# Patient Record
Sex: Female | Born: 1950 | Race: White | Hispanic: No | Smoking: Former smoker
Health system: Southern US, Community
[De-identification: ages and names within clinical notes are randomized; demographics above are authoritative.]

## PROBLEM LIST (undated history)

## (undated) DIAGNOSIS — T7840XA Allergy, unspecified, initial encounter: Secondary | ICD-10-CM

## (undated) DIAGNOSIS — E785 Hyperlipidemia, unspecified: Secondary | ICD-10-CM

## (undated) DIAGNOSIS — F32A Depression, unspecified: Secondary | ICD-10-CM

## (undated) DIAGNOSIS — F329 Major depressive disorder, single episode, unspecified: Secondary | ICD-10-CM

## (undated) DIAGNOSIS — K3184 Gastroparesis: Secondary | ICD-10-CM

## (undated) DIAGNOSIS — J449 Chronic obstructive pulmonary disease, unspecified: Secondary | ICD-10-CM

## (undated) DIAGNOSIS — Z5189 Encounter for other specified aftercare: Secondary | ICD-10-CM

## (undated) DIAGNOSIS — J439 Emphysema, unspecified: Secondary | ICD-10-CM

## (undated) DIAGNOSIS — F419 Anxiety disorder, unspecified: Secondary | ICD-10-CM

## (undated) HISTORY — DX: Chronic obstructive pulmonary disease, unspecified: J44.9

## (undated) HISTORY — DX: Hyperlipidemia, unspecified: E78.5

## (undated) HISTORY — DX: Gastroparesis: K31.84

## (undated) HISTORY — DX: Encounter for other specified aftercare: Z51.89

## (undated) HISTORY — PX: TONSILLECTOMY: SUR1361

## (undated) HISTORY — PX: ABDOMINAL HYSTERECTOMY: SHX81

## (undated) HISTORY — DX: Allergy, unspecified, initial encounter: T78.40XA

## (undated) HISTORY — DX: Anxiety disorder, unspecified: F41.9

## (undated) HISTORY — DX: Emphysema, unspecified: J43.9

## (undated) HISTORY — DX: Major depressive disorder, single episode, unspecified: F32.9

## (undated) HISTORY — DX: Depression, unspecified: F32.A

## (undated) HISTORY — PX: SPINE SURGERY: SHX786

## (undated) HISTORY — PX: OTHER SURGICAL HISTORY: SHX169

## (undated) HISTORY — PX: TUBAL LIGATION: SHX77

## (undated) HISTORY — PX: BACK SURGERY: SHX140

---

## 2005-05-28 ENCOUNTER — Emergency Department (HOSPITAL_COMMUNITY): Admission: EM | Admit: 2005-05-28 | Discharge: 2005-05-29 | Payer: Self-pay | Admitting: Emergency Medicine

## 2005-08-20 ENCOUNTER — Emergency Department (HOSPITAL_COMMUNITY): Admission: EM | Admit: 2005-08-20 | Discharge: 2005-08-21 | Payer: Self-pay | Admitting: Emergency Medicine

## 2005-09-12 ENCOUNTER — Ambulatory Visit (HOSPITAL_COMMUNITY): Admission: RE | Admit: 2005-09-12 | Discharge: 2005-09-12 | Payer: Self-pay | Admitting: Family Medicine

## 2005-10-12 ENCOUNTER — Ambulatory Visit (HOSPITAL_COMMUNITY): Admission: RE | Admit: 2005-10-12 | Discharge: 2005-10-12 | Payer: Self-pay | Admitting: Family Medicine

## 2005-10-13 ENCOUNTER — Ambulatory Visit: Payer: Self-pay | Admitting: Gastroenterology

## 2005-10-20 ENCOUNTER — Ambulatory Visit (HOSPITAL_COMMUNITY): Admission: RE | Admit: 2005-10-20 | Discharge: 2005-10-20 | Payer: Self-pay | Admitting: Gastroenterology

## 2005-11-30 ENCOUNTER — Ambulatory Visit: Payer: Self-pay | Admitting: Gastroenterology

## 2005-12-03 ENCOUNTER — Emergency Department (HOSPITAL_COMMUNITY): Admission: EM | Admit: 2005-12-03 | Discharge: 2005-12-03 | Payer: Self-pay | Admitting: Emergency Medicine

## 2006-02-23 ENCOUNTER — Ambulatory Visit: Payer: Self-pay | Admitting: Gastroenterology

## 2006-03-29 ENCOUNTER — Ambulatory Visit: Payer: Self-pay | Admitting: Gastroenterology

## 2006-04-07 ENCOUNTER — Ambulatory Visit: Payer: Self-pay | Admitting: Psychiatry

## 2006-04-07 ENCOUNTER — Inpatient Hospital Stay (HOSPITAL_COMMUNITY): Admission: AD | Admit: 2006-04-07 | Discharge: 2006-04-16 | Payer: Self-pay | Admitting: Psychiatry

## 2006-04-07 ENCOUNTER — Emergency Department (HOSPITAL_COMMUNITY): Admission: EM | Admit: 2006-04-07 | Discharge: 2006-04-07 | Payer: Self-pay | Admitting: Emergency Medicine

## 2006-04-20 ENCOUNTER — Inpatient Hospital Stay (HOSPITAL_COMMUNITY): Admission: AD | Admit: 2006-04-20 | Discharge: 2006-04-30 | Payer: Self-pay | Admitting: Psychiatry

## 2006-05-22 ENCOUNTER — Ambulatory Visit: Payer: Self-pay | Admitting: Gastroenterology

## 2006-08-02 ENCOUNTER — Ambulatory Visit: Payer: Self-pay | Admitting: Gastroenterology

## 2006-09-13 ENCOUNTER — Ambulatory Visit: Payer: Self-pay | Admitting: Gastroenterology

## 2006-10-30 ENCOUNTER — Ambulatory Visit (HOSPITAL_COMMUNITY): Admission: RE | Admit: 2006-10-30 | Discharge: 2006-10-30 | Payer: Self-pay | Admitting: Family Medicine

## 2006-11-08 ENCOUNTER — Ambulatory Visit (HOSPITAL_COMMUNITY): Admission: RE | Admit: 2006-11-08 | Discharge: 2006-11-08 | Payer: Self-pay | Admitting: Oncology

## 2006-11-28 ENCOUNTER — Ambulatory Visit (HOSPITAL_COMMUNITY): Admission: RE | Admit: 2006-11-28 | Discharge: 2006-11-28 | Payer: Self-pay | Admitting: Family Medicine

## 2007-03-21 ENCOUNTER — Ambulatory Visit: Payer: Self-pay | Admitting: Gastroenterology

## 2007-10-01 ENCOUNTER — Ambulatory Visit: Payer: Self-pay | Admitting: Gastroenterology

## 2007-10-10 ENCOUNTER — Ambulatory Visit: Payer: Self-pay | Admitting: Gastroenterology

## 2007-10-10 ENCOUNTER — Ambulatory Visit (HOSPITAL_COMMUNITY): Admission: RE | Admit: 2007-10-10 | Discharge: 2007-10-10 | Payer: Self-pay | Admitting: Gastroenterology

## 2007-10-10 ENCOUNTER — Encounter: Payer: Self-pay | Admitting: Gastroenterology

## 2007-10-10 HISTORY — PX: COLONOSCOPY: SHX174

## 2008-01-24 DIAGNOSIS — R160 Hepatomegaly, not elsewhere classified: Secondary | ICD-10-CM

## 2008-01-24 DIAGNOSIS — J439 Emphysema, unspecified: Secondary | ICD-10-CM | POA: Insufficient documentation

## 2008-01-24 DIAGNOSIS — E785 Hyperlipidemia, unspecified: Secondary | ICD-10-CM | POA: Insufficient documentation

## 2008-01-24 DIAGNOSIS — K3184 Gastroparesis: Secondary | ICD-10-CM

## 2008-04-17 ENCOUNTER — Ambulatory Visit: Payer: Self-pay | Admitting: Gastroenterology

## 2009-03-31 ENCOUNTER — Encounter (INDEPENDENT_AMBULATORY_CARE_PROVIDER_SITE_OTHER): Payer: Self-pay | Admitting: *Deleted

## 2009-05-06 ENCOUNTER — Ambulatory Visit: Payer: Self-pay | Admitting: Gastroenterology

## 2010-04-13 ENCOUNTER — Encounter (INDEPENDENT_AMBULATORY_CARE_PROVIDER_SITE_OTHER): Payer: Self-pay | Admitting: *Deleted

## 2010-05-02 ENCOUNTER — Ambulatory Visit
Admission: RE | Admit: 2010-05-02 | Discharge: 2010-05-02 | Payer: Self-pay | Source: Home / Self Care | Attending: Gastroenterology | Admitting: Gastroenterology

## 2010-05-10 NOTE — Assessment & Plan Note (Signed)
Summary: GASTROPARESIS   Visit Type:  Follow-up Visit Primary Care Provider:  Nobie Putnam, M.D.  Chief Complaint:  follow up- doing ok.  History of Present Illness: No questions or concerns. Rare vomiting.  Thought about getting off of pills. Stopped and next meal she vomited. Taking twice a day. Bms vary: tight, none, watery. rare abd pain:  bilateral lwr or LUQ  Current Medications (verified): 1)  Cymbalta 60 Mg Cpep (Duloxetine Hcl) .... Take 1 Capsule By Mouth Once A Day 2)  Ambien 10 Mg Tabs (Zolpidem Tartrate) .... At Bedtime 3)  Chlordiazepoxide Hcl 10 Mg Caps (Chlordiazepoxide Hcl) .... 10mg  Two Times A Day and 5 Mg At Noon 4)  Reglan 5 Mg Tabs (Metoclopramide Hcl) .... Take 1 Tablet By Mouth Three Times A Day 5)  Benefiber  Powd (Wheat Dextrin) .... Take Daily or As Needed 6)  Promethazine .... Take One Tablet By Mouth As Needed 7)  Tylenol Xs .... Take One Tablet As Needed 8)  Ns Nasal Spray .... Take 2 Sprays in Each Nostril As Needed 9)  Abreva 10 % Crea (Docosanol) .... Take As Needed 10)  Proventil Hfa 108 (90 Base) Mcg/act Aers (Albuterol Sulfate) .... Inhale 2 Puffs As Needed 11)  Duoneb 0.5-2.5 (3) Mg/74ml Soln (Ipratropium-Albuterol) .... Take Four Times A Day or As Needed 12)  Visine Tears .... Take As Needed  Allergies (verified): 1)  ! Ibuprofen  Past History:  Past Medical History: Gastroparesis Anxiety Disorder COPD Depression Hyperlipidemia  Vital Signs:  Patient profile:   60 year old female Height:      62 inches Weight:      155 pounds BMI:     28.45 Temp:     97.9 degrees F oral Pulse rate:   68 / minute BP sitting:   120 / 74  (left arm) Cuff size:   regular  Vitals Entered By: Hendricks Limes LPN (May 06, 2009 2:05 PM)  Physical Exam  General:  Well developed, well nourished, no acute distress. Head:  Normocephalic and atraumatic. Lungs:  Clear throughout to auscultation. Heart:  Regular rate and rhythm; no murmurs Abdomen:  Soft,  nontender and nondistended. Normal bowel sounds. Neurologic:  Alert and  oriented x4;  grossly normal neurologically.  Impression & Recommendations:  Problem # 1:  GASTROPARESIS (ICD-536.3) Assessment Improved  May try once a day Reglan, rfx11. Phenergan as needed, rfx5. OPV 12 mos. TCS 2019.  CC: PCP  Orders: Est. Patient Level II (44315) Prescriptions: PROMETHAZINE HCL 25 MG TABS (PROMETHAZINE HCL) 1-2 by mouth q4-6h as needed nausea or vomiting  #30 x 5   Entered and Authorized by:   West Bali MD   Signed by:   West Bali MD on 05/06/2009   Method used:   Electronically to        [facility]  [address] (retail)       [address]       [place], [place]  [address]       Ph: [phone]       Fax: [phone]   RxID:   [id number] REGLAN 5 MG TABS (METOCLOPRAMIDE HCL) Take 1 tablet by mouth three times a day  #90 x 11   Entered and Authorized by:   West Bali MD   Signed by:   West Bali MD on 05/06/2009   Method used:   Electronically to        [facility]  [address] (retail)       [address]       [place], [place]  [address]       Ph: [phone]       Fax: [phone]   RxID:   [id number]

## 2010-05-12 NOTE — Letter (Signed)
Summary: Recall Office Visit  [facility]  [address]   [place], [place] [address]   Phone: [phone]  Fax: [phone]      April 13, 2010   GYPSY KELLOGG [address] [place], [place]  [address] 04/13/1950   Dear Ms. Pilley,   According to our records, it is time for you to schedule a follow-up office visit with [place].   At your convenience, please call [phone] to schedule an office visit. If you have any questions, concerns, or feel that this letter is in error, we would appreciate your call.   Sincerely,    Diana Eves  [facility] Ph: [phone]   Fax: [phone]

## 2010-05-12 NOTE — Assessment & Plan Note (Signed)
Summary: ONE YR F/U NEEDS REFILL ON MED/LAW   Visit Type:  Follow-up Visit Primary Care Provider:  Nobie Putnam, M.D.  CC:  F/U for meds.  History of Present Illness: Suzanne Walker presents today in routine follow-up for hx of gastroparesis. Taking Reglan three times a day. Denies N/V. However, if she tries to decrease to twice/day, nausea returns. One episode of feeling dizzy/diarrhea over Christmas, but this was an isolated incidence. reports lower abdominal discomfort, like "cramping", mostly associated with BMs. This is chronic in nature. Reports a BM every day, sometimes 2-3X per day. No melena or hematochezia. Was supposed to be taking align but doesn't want to take one extra pill. No evidence of melena or hematochezia. No complaints today. just needs refills on reglan.   Current Medications (verified): 1)  Cymbalta 60 Mg Cpep (Duloxetine Hcl) .... Take 1 Capsule By Mouth Once A Day 2)  Ambien 10 Mg Tabs (Zolpidem Tartrate) .... At Bedtime 3)  Reglan 5 Mg Tabs (Metoclopramide Hcl) .... Take 1 Tablet By Mouth Three Times A Day 4)  Benefiber  Powd (Wheat Dextrin) .... Take Daily or As Needed 5)  Promethazine .... Take One Tablet By Mouth As Needed 6)  Tylenol Xs .... Take One Tablet As Needed 7)  Ns Nasal Spray .... Take 2 Sprays in Each Nostril As Needed 8)  Abreva 10 % Crea (Docosanol) .... Take As Needed 9)  Proventil Hfa 108 (90 Base) Mcg/act Aers (Albuterol Sulfate) .... Inhale 2 Puffs As Needed 10)  Duoneb 0.5-2.5 (3) Mg/21ml Soln (Ipratropium-Albuterol) .... Take Four Times A Day or As Needed 11)  Visine Tears .... Take As Needed 12)  Promethazine Hcl 25 Mg Tabs (Promethazine Hcl) .Marland Kitchen.. 1-2 By Mouth Q4-6h As Needed Nausea or Vomiting 13)  Librium 10mg  .... One Tablet Twice Daily  Allergies (verified): 1)  ! 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 on the patch Alcohol Use - no Smoking Status:  quit  Review of Systems General:  Denies fever, chills, and anorexia. Eyes:  Denies blurring, irritation, and discharge. ENT:  Denies sore throat, hoarseness, and difficulty swallowing. CV:  Denies chest pains and syncope. Resp:  Denies dyspnea at rest and wheezing. GI:  Denies difficulty swallowing, pain on swallowing, nausea, abdominal pain, change in bowel habits, bloody BM's, and black BMs. GU:  Denies urinary burning and urinary frequency. MS:  Denies joint pain / LOM, joint swelling, and joint stiffness. Derm:  Denies rash, itching, and dry skin. Neuro:  Denies weakness and syncope. Psych:  Denies depression and anxiety. Endo:  Denies cold intolerance and heat intolerance. Heme:  Denies bruising and bleeding. Allergy:  Denies hives and rash.  Vital Signs:  Patient profile:   60 year old female Height:      62 inches Weight:      152 pounds BMI:     27.90 Temp:     98.2 degrees F oral BP sitting:   110 / 80  (left arm) Cuff size:   regular  Vitals Entered By: Suzanne Walker (May 02, 2010 10:35 AM)  Physical Exam  General:  Well developed, well nourished, no acute distress. Head:  Normocephalic and atraumatic. Eyes:  PERRLA, no icterus. Lungs:  Clear throughout to auscultation. Heart:  Regular rate and rhythm; no murmurs, rubs,  or bruits. Abdomen:  Soft, nontender and nondistended. No masses, hepatosplenomegaly or hernias noted. Normal bowel sounds. no rebound or guarding.  Msk:  Symmetrical with no gross deformities. Normal posture. Neurologic:  Alert and  oriented x4;  grossly normal  neurologically. Psych:  Alert and cooperative. Normal mood and affect.  Impression & Recommendations:  Problem # 1:  GASTROPARESIS (ICD-38.21)  60 year old Caucasian female with hx of gastroparesis, doing well on reglan threex/day. Has attempted to wean down but unable to at this time. No N/V. Occasional lower abdominal discomfort, which seems to be r/t BM. This is actually a chronic finding. Was given align but does not want to take any additional pills.  Continue Reglan as directed, refills sent to pharmacy. May attempt to wean to twice/day if able in the future Add fiber to diet. Consider a probiotic Return in 1 year or sooner as indicated  Orders: Est. Patient Level II (84696) Prescriptions: REGLAN 5 MG TABS (METOCLOPRAMIDE HCL) Take 1 tablet by mouth three times a day  #90 x 11   Entered and Authorized by:   Gerrit Halls NP   Signed by:   Gerrit Halls NP on 05/02/2010   Method used:   Faxed to ...       [facility]  [address] (retail)       [address]       [place], [place]  [address]       Ph: [phone]       Fax: [phone]   RxID:   [id number]   Appended Document: ONE YR F/U NEEDS REFILL ON MED/LAW ONE YR F/U OPV IS IN THE COMPUTER

## 2010-08-23 NOTE — Assessment & Plan Note (Signed)
NAMEMarland Kitchen  Suzanne Walker, Suzanne Walker                CHART#:  [id number]   DATE:  03/21/2007                       DOB:  Apr 22, 1950   REFERRING PHYSICIAN:  Patrica Duel, MD.   PROBLEM LIST:  1. Gastroparesis.  2. Chronic obstructive pulmonary disease.  3. Hyperlipidemia.  4. Depression.   SUBJECTIVE:  Ms. Suzanne Walker is a 60 year old female who is seen as a return  patient visit.  She was last seen in April of 200.  She only has  problems with her gastroparesis once in awhile.  When she does have  problems it is just a little spit up.  Most of the time she knows  which foods might trigger one of these episodes.  She feels good.  She  is gaining weight.  She has had no shaking, or significant discharge  from her breasts.  She occasionally has had some discharge from her  breasts always and it is no different.  She is trying to quit smoking.  She has 0 to 3 bowel movements a day.  Her bowel movements are different  consistency but usually soft.  If she is more active she usually does  not use the bathroom as much.   OBJECTIVE:  Weight 147 pounds (up 20 pounds since April of 2008), height  5 feet 2 inches, BMI 26.9 (overweight), blood pressure 108/72, pulse  88.GENERAL:  She is in no apparent distress, alert and oriented x4 and  her affect is labile.  She appears in good spirits.  LUNGS:  Clear to auscultation bilaterally.CARDIOVASCULAR EXAM:  A  regular rhythm, no murmur.ABDOMEN:  Bowel sounds are present, soft,  nontender, nondistended.   ASSESSMENT:  Ms. Suzanne Walker is a 60 year old female who has gastroparesis  and her symptoms are well controlled on Reglan.  She does not appear to  have any side effects.  Thank you for allowing me to see Ms. Suzanne Walker in  consultation.  My recommendations follow.   RECOMMENDATIONS:  1. Will refill her Reglan.  She may continue to take it 5 mg before      meals and at bedtime or at least twice daily.  2. Benefiber daily.  3. Return patient visit in 6  months.       Kassie Mends, M.D.  Electronically Signed     SM/MEDQ  D:  03/21/2007  T:  03/22/2007  Job:  [id number]   cc:   Patrica Duel, M.D.

## 2010-08-23 NOTE — Assessment & Plan Note (Signed)
NAMEMarland Kitchen  SHENANDOAH, YEATS                CHART#:  [id number]   DATE:  04/17/2008                       DOB:  02/14/1951   REFERRING PHYSICIAN:  Patrica Duel, MD   PROBLEM LIST:  1. Gastroparesis.  2. Chronic obstructive pulmonary disease.  3. Hyperlipidemia.  4. Depression.   SUBJECTIVE:  The patient is a 60 year old female who presents as a  return patient visit.  She was last seen in June 2009.  She rarely has  nausea.  She only vomits if she gets nervous.  Her appetite has been  fair.  Sometimes she has some abdominal pain prior to bowel movement and  it is relieved after she has a bowel movement.  She usually has one  bowel movement a day, and sometimes every 2 days.   MEDICATIONS:  1. Tylenol as needed.  2. Normal saline nasal spray as needed.  3. Abreva as needed.  4. Phenergan as needed.  5. Proventil.  6. DuoNeb.  7. Cymbalta 60 mg daily.  8. Ambien 10 mg at nighttime.  9. Chlordiazepoxide 10 mg 3 times a day.  10.Reglan 5 mg t.i.d.  11.Benefiber as needed.  12.Visine tears as needed.   OBJECTIVE:  VITAL SIGNS:  Weight 157 pounds (up 8 pounds since June  2009), height 5 feet 2 inches, BMI 28.7 (overweight), temperature 97.4,  blood pressure 110/70, and pulse 100.GENERAL:  She is in no apparent  distress.LUNGS:  Clear to auscultation bilaterally.CARDIOVASCULAR:  Regular rhythm.  No murmur.ABDOMEN:  Bowel sounds are present, soft,  nontender, slightly obese.EXTREMITIES:  No edema.   ASSESSMENT:  The patient is a 60 year old female with gastroparesis, is  doing very well.  Thank you for allowing me to see the patient in  consultation.  My recommendations follow.   RECOMMENDATIONS:  1. Screening colonoscopy in 2019.  2. Refilled her Reglan for 1 year.  She is to take 5 mg q.a.c. and if      needed nightly #120.  3. Follow up appointment in 12 months.       Kassie Mends, M.D.  Electronically Signed     SM/MEDQ  D:  04/17/2008  T:  04/17/2008  Job:  [id number]   cc:   Patrica Duel, M.D.

## 2010-08-23 NOTE — Assessment & Plan Note (Signed)
NAMEMarland Walker  [name], SMEJKAL                CHART#:  [id number]   DATE:  10/01/2007                       DOB:  [birth year]   REFERRING PHYSICIAN:  Patrica Duel, MD   PROBLEM LIST:  1. Gastroparesis.  2. Chronic obstructive pulmonary disease.  3. Hyperlipidemia.  4. Depression.   SUBJECTIVE:  The patient is a 60 year old female who presents as a  return patient visit.  She has been clean enough and taking care of her  grandchild  to occupy her time.  Her appetite has been good.  She is  somewhat following her gastroparesis diet.  Occasionally, she deviates  from the recommendations.  She is having at least one bowel movement a  day, sometimes two.  She has normal sausage like stool.  She is using  the Reglan 2-3 times a day.  She has not had any vision changes, breast  discharge, or involuntary movement.  Sometimes, she takes the Benefiber  and sometimes she does not.   MEDICATIONS:  1. Cymbalta.  2. Ambien.  3. Chlordiazepoxide 10 mg t.i.d.  4. Reglan.  5. Benefiber as needed.  6. Promethazine as needed.   OBJECTIVE:   PHYSICAL EXAMINATION:  VITAL SIGNS:  Weight 149 pounds (unchanged since  December 2008), height 5 feet 2 inches, BMI 27.2 (overweight)  Temperature 97.8, blood pressure 100/70, and pulse 88.GENERAL:  She is  in no apparent distress.  Alert and oriented x4.LUNGS:  Clear to  auscultation bilaterally.CARDIOVASCULAR:  Regular rhythm.ABDOMEN:  Bowel  sounds are present.  Soft, nontender, and nondistended.   ASSESSMENT:  The patient is a 60 year old female who has gastroparesis,  which is well controlled.  She has no family history of colon cancer,  colon polyps, and has never had age-appropriate colon cancer screening.  Thank you for allowing me to see the patient in consultation.  My  recommendations as follow.   RECOMMENDATIONS:  1. Continue the Reglan.  2. Will schedule her in July with a MiraLax bowel prep prior to her      colonoscopy.  3. Return patient  visit in 6 months.       Kassie Mends, M.D.  Electronically Signed     SM/MEDQ  D:  10/01/2007  T:  10/02/2007  Job:  [id number]   cc:   Patrica Duel, M.D.

## 2010-08-23 NOTE — Op Note (Signed)
Suzanne Walker, Suzanne Walker NO.:  [id number]   MEDICAL RECORD NO.:  [id number]          PATIENT TYPE:  AMB   LOCATION:  DAY                           FACILITY:  [facility]   PHYSICIAN:  Kassie Mends, M.D.      DATE OF BIRTH:  05-Oct-1950   DATE OF PROCEDURE:  10/10/2007  DATE OF DISCHARGE:                               OPERATIVE REPORT   REFERRING PHYSICIAN:  Patrica Duel, MD   PROCEDURE:  Colonoscopy with cold forceps and snare cautery polypectomy.   INDICATIONS FOR EXAMINATION:  Suzanne Walker is a 60 year old female who  presents for average risk colon cancer screening.   FINDINGS:  1. A 4-mm sessile descending colon polyp removed via cold forceps.  A      8-mm sessile sigmoid colon polyp removed via snare cautery.  2. Rare sigmoid diverticula.  3. Extremely angulated rectosigmoid junction which required a change      from an adult colonoscope to the pediatric colonoscope and the      patient to be in the prone position in order to advance the scope      beyond this point.  Otherwise, no masses, inflammatory changes, or      AVMs seen.  4. Normal retroflex view of the rectum.   RECOMMENDATIONS:  1. Continue Benefiber.  2. We will call Suzanne Walker with results of her biopsies.  She has a      simple adenoma, then would recommend a screening colonoscopy in 10      years.  3. She is given a handout on diverticulosis.  No aspirin, NSAIDs, or      anticoagulation for 7 days.   MEDICATIONS:  1. Demerol 125 mg IV.  2. Versed 10 mg IV.  3. Phenergan 12.5 mg IV.   PROCEDURE TECHNIQUE:  Physical exam was performed.  Informed consent was  obtained from the patient after explaining the benefits, risks, and  alternatives to the procedure.  The patient was connected to monitor and  placed in the left lateral position.  Continuous oxygen was provided by  nasal cannula.  IV medicine was administered through an indwelling  cannula.  After ministration of sedation and rectal exam,  the patient's  rectum was intubated.  The scope was advanced under direct visualization  to the cecum.  The scope was removed slowly by carefully examining  the color, texture, anatomy, and integrity of the mucosa on the way out.  The patient was recovered in endoscopy and discharged home in  satisfactory condition.   PATH:  Simpel adenoma.      Kassie Mends, M.D.  Electronically Signed     SM/MEDQ  D:  10/10/2007  T:  10/10/2007  Job:  [id number]   cc:   Patrica Duel, M.D.  Fax: [phone]

## 2010-08-23 NOTE — Assessment & Plan Note (Signed)
NAMEMarland Kitchen  Suzanne Walker, Suzanne Walker                CHART#:  [id number]   DATE:  09/13/2006                       DOB:  12/14/1950   REFERRING PHYSICIAN:  Patrica Duel, M.D.   PROBLEM LIST:  1. Gastroparesis.  2. COPD.  3. Hyperlipidemia.  4. Depression.   SUBJECTIVE:  Ms. Suzanne Walker is a 60 year old female who presents as a return  patient visit. She has not complaints of vomiting, nausea, or abdominal  pain. She has been doing very well. She continues to see a mental health  specialist and participates in group therapy. Her energy level is really  good. She has been walking. She does not need any refills. She is also  gaining weight.   MEDICATIONS:  1. Ambien 10 mg at nighttime.  2. Librium 10 mg 3 times a day.  3. Reglan 5 mg 3 times a day.  4. Cymbalta 60 mg daily.  5. Benefiber daily.  6. Align daily.  7. Phenergan as needed.   OBJECTIVE:  Weight 128.5 pounds (up 6 and half pounds since April 2008),  height 5 feet, 2 and a half inches, temperature 97.7, blood pressure  110/66, pulse 80.  GENERAL: She is in no apparent distress, alert and oriented x4. LUNGS:  Clear to auscultation bilaterally. CARDIOVASCULAR EXAM: Regular rhythm.  No murmur. ABDOMEN: Bowel sounds present. Nontender, nondistended.   ASSESSMENT:  Ms. Suzanne Walker is a 60 year old female with gastroparesis who  is doing very well. She denies any problems with diarrhea. Thank you for  allowing me to see Ms. Suzanne Walker in consultation. My recommendations  follow.   RECOMMENDATIONS:  1. She should continue the Benefiber, and Librium, as well as the      Reglan, and Align.  2. She has a follow up appointment to see me in 6 months.       Kassie Mends, M.D.  Electronically Signed     SM/MEDQ  D:  09/13/2006  T:  09/13/2006  Job:  [id number]   cc:   Patrica Duel, M.D.

## 2010-08-26 NOTE — Discharge Summary (Signed)
NAMESUMIYA, MAMARIL NO.:  [id number]   MEDICAL RECORD NO.:  [id number]          PATIENT TYPE:  IPS   LOCATION:  0307                          FACILITY:  [facility]   PHYSICIAN:  Anselm Jungling, MD  DATE OF BIRTH:  03/30/1951   DATE OF ADMISSION:  04/20/2006  DATE OF DISCHARGE:  04/30/2006                               DISCHARGE SUMMARY   REASON FOR ADMISSION:  This was the second [facility] admission for Suzanne Walker, a  60 year old married female who had just been discharged from our  inpatient service 4 days prior, on 04/16/2006.  She had been admitted  for mood disorder and benzodiazepine dependence.   Shortly before discharge, she was begun on a trial of Risperdal,  Seroquel, and Neurontin.  Although she appeared to tolerate her initial  doses of these in the inpatient setting, when she went home, she  complained of various physical complaints, such as dry mouth, swelling  in her legs, and blisters around her mouth.  She was in contact with the  undersigned during that interval, and, finally, on the day of admission,  I instructed her over the phone to come back into the hospital.  Please  refer to the previous admission and discharge summaries for further  details pertaining to the symptoms, circumstances, and history that led  to her previous hospitalization and this readmission.  The initial Axis  I diagnosis was benzodiazepine dependence and mood disorder NOS.   MEDICAL AND LABORATORY:  The patient was medically and physically  assessed by the psychiatric nurse practitioner upon readmission.  She  came to [place] again with a history of emphysema, back pain, and gastric  emptying problems.  She was continued on Reglan 5 mg 4x daily.  There  were no acute medical issues during this inpatient stay.   HOSPITAL COURSE:  The patient was admitted to the adult inpatient  psychiatric service.  She presented as a thin, but essentially healthy  woman who was quite depressed,  anxious, but denied suicidal ideation and  verbalized a strong desire for help.  She was not continued on  Risperdal, Seroquel, or Neurontin, as she had complained of various  intolerances while she was on these.   My initial assessment was that she was still experiencing some  benzodiazepine withdrawal, which was a source of some of her dysphoria  and anxiety.  Because of this, she was started on a low-dose Librium  taper that began with Librium 10 mg q.i.d. and was gradually decreased  to 10 mg q.a.m. at the time of discharge.   The patient also consented to a trial of Lexapro, and it was begun at a  low dose of 5 mg due to the patient's proclivity to produce side  effects.  She seemed to tolerate the Lexapro reasonably well, and, prior  to discharge, the dose was increased to 10 mg daily.   The patient participated in various therapeutic groups and activities.  She was a reasonably good participant in the treatment program, but  continued dysphoric and anxious much of the time.  She especially  exhibited  a lot of apprehension and anxiety about medications, tending  to have a strong expectation that she was going to become intolerant to  anything that she took, or that it would not be effective in addressing  the targeted symptoms intended.   By the 11th hospital day, it appeared that the patient was well stable  enough for discharge, and there did not appear to be any further  potential benefit in her extending her stay.  She agreed to discharge in  the following aftercare plan.   AFTERCARE:  The patient was to follow-up with [facility] on 05/01/2006, which  was to provide a certain degree of regular contact for the patient, and  with Dr. Donell Beers, for medication management, on 05/07/2006.   DISCHARGE MEDICATIONS:  1. Reglan 5 mg 4x daily.  2. Lexapro 10 mg daily.  3. Librium 10 mg once each morning for 7 days then discontinue.  4. Ambien 10 mg at bedtime only as needed for sleep.    DISCHARGE DIAGNOSES:  AXIS I: Depressive disorder NOS and benzodiazepine  dependence, resolving.  AXIS II: Deferred.  AXIS III: History of emphysema, gastric emptying problems, and chronic  pain.  AXIS IV: Stressors severe.  AXIS V: GAF on discharge 60.   The patient was instructed to contact us and the undersigned regarding  any specific difficulties or questions between the time of her discharge  and her meeting with Dr. Donell Beers on 05/07/2006.      Anselm Jungling, MD  Electronically Signed     SPB/MEDQ  D:  05/01/2006  T:  05/01/2006  Job:  [id number]

## 2010-08-26 NOTE — Procedures (Signed)
Suzanne Walker, GUIN NO.:  [id number]   MEDICAL RECORD NO.:  [id number]          PATIENT TYPE:  OUT   LOCATION:  RESP                          FACILITY:  [facility]   PHYSICIAN:  Edward L. Juanetta Gosling, M.D.DATE OF BIRTH:  [birth year]   DATE OF PROCEDURE:  09/13/2005  DATE OF DISCHARGE:  09/12/2005                              PULMONARY FUNCTION TEST   PROCEDURE:  Pulmonary function test.  1.  Spirometry shows a mild ventilatory defect with evidence of airflow      obstruction.  2.  Lung volumes are normal with no restrictive disease.  3.  DLCO is moderately reduced.  4.  Arterial blood gases are normal.  5.  There is no significant bronchodilator effect.      Edward L. Juanetta Gosling, M.D.  Electronically Signed     ELH/MEDQ  D:  09/13/2005  T:  09/14/2005  Job:  [id number]   cc:   Patrica Duel, M.D.  Fax: [phone]

## 2010-08-26 NOTE — H&P (Signed)
Suzanne Walker, HELLMANN NO.:  [id number]   MEDICAL RECORD NO.:  [id number]          PATIENT TYPE:  IPS   LOCATION:  0304                          FACILITY:  [facility]   PHYSICIAN:  Vic Ripper, P.A.-C.DATE OF BIRTH:  [birth year]   DATE OF ADMISSION:  04/07/2006  DATE OF DISCHARGE:                       PSYCHIATRIC ADMISSION ASSESSMENT   This is a voluntary admission to the services of Dr. Geralyn Flash.   ADMISSION DIAGNOSES:   AXIS I:  Major depressive disorder with a suicide attempt by overdose on  prescribed medication.   AXIS II:  Deferred.   AXIS III:  1. Chronic obstructive pulmonary disease.  2. Gastric emptying issues.  3. Status post back surgery x2.   AXIS IV:  Problems with primary support group, occupational, and medical  issues.   AXIS V:  Thirty.   IDENTIFYING INFORMATION:  This is a 60 year old, married, white female.  She was brought to the Emergency Department at [facility] after  her husband had difficulty awakening her.  It was determined that she  had taken an overdose the evening before, approximately 30 Temazepam and  an unknown amount of Alprazolam at 10:30 at night the night before.  She  states that she did this as she is upset about her health.  Today, she  is very depressed.  She has very poor judgment and insight.  She is  refusing to take medications.   PAST PSYCHIATRIC HISTORY:  Apparently she was sent to [facility] in June of 2007.  She states they did not keep her, that she  did not belong there.  She was initiated on Celexa there; however,  this was stopped due to diarrhea.  She states she was seen at [facility] approximately 14 days ago.  She was started on Mirtazapine,  which was decreased in dosage a couple of days ago after a telephone  call.  She states she presented there because of non-stop shakes.  She  states I have panic attacks.   SOCIAL HISTORY:  She went to the 11th grade.   She has a GED.  She has  been married once.  She has a daughter, age 60, a son, age 36.  She has  not been employed since 1999.  She has had two back surgeries.  She has  gastric emptying issues as well as back pain and COPD.   FAMILY HISTORY:  She denies.  She continues to smoke a half pack of  cigarettes per day.  Her primary care Lucia Harm is Dr. Patrica Duel.   MEDICAL PROBLEMS:  She is known to have:  1. Emphysema.  2. Back pain.  3. Stomach emptying issues.   MEDICATIONS:  The record shows that she is currently prescribed:  1. AcipHex 20 mg one p.o. daily.  2. Tranxene 7.5 mg, one in the a.m., two at h.s.  3. Xanax-ER 0.25 mg p.o. daily.  4. Phenergan 25 mg p.o. q.4h p.r.n.  5. Proventil two puffs q.i.d.  6. __________ 30 mg, take 15 at h.s.   DRUG ALLERGIES:  IBUPROFEN.  It gives her  stomach upset.   POSITIVE PHYSICAL FINDINGS:  GENERAL:  She was medically cleared in the  ED at [facility].  Her UDS was positive for benzos.  She had no  other remarkable findings.  VITAL SIGNS:  Her vital signs on admission to the unit show she is  61inches tall, weighs 98 pounds, temperature is 97, blood pressure is  109/63, pulse is 107.  BACK:  She does have a scar in her lumbar area consistent with her  history for back surgery.  ABDOMEN:  She has a small scar, prepubertal area, from a hysterectomy.   MENTAL STATUS EXAM:  She is alert.  She is somewhat unkempt.  Her speech  can be clear.  It is not pressured.  Her mood is depressed and also  anxious.  Her affect is congruent.  Her thought processes are not clear  or rational.  Judgment and insight are poor.  Concentration and memory  are fair.  Intelligence is average.  She is still suicidal.  She denies  being homicidal.  She denies auditory or visual hallucinations.   PLAN:  To admit for further safety and stabilization, to adjust her meds  as indicated, and we will have the case manager help with setting up a  therapist  on discharge.      Vic Ripper, P.A.-C.     MD/MEDQ  D:  04/08/2006  T:  04/08/2006  Job:  [id number]

## 2010-08-26 NOTE — H&P (Signed)
Suzanne Walker, ZEGERS NO.:  [id number]   MEDICAL RECORD NO.:  [id number]          PATIENT TYPE:  IPS   LOCATION:  0307                          FACILITY:  [facility]   PHYSICIAN:  Anselm Jungling, MD  DATE OF BIRTH:  Sep 08, 1950   DATE OF ADMISSION:  04/20/2006  DATE OF DISCHARGE:                       PSYCHIATRIC ADMISSION ASSESSMENT   This is a 60 year old married white female.  She was just discharged on  January 7.  She states that since returning home, she was anxious.  Her  mouth was dry.  Her eyes felt like they were stuck together, had not  been taking her Neurontin, Reglan, or Risperdal since returning home.  She reported a cyst on her left breast since going home and states that  when she called Dr. Electa Sniff, he told her to hold the Risperdal and the  cyst went away.  She also reports to me that she was seen by her private  family medical doctor because she felt that her feet and legs were  swelling from the Neurontin, and she also reported that her mouth would  move on its on.  States that she shakes all the time and wonders how  long it will be until she is over benzodiazepine withdrawal syndrome.  She also reads the PDR and looks up all side effects.  It is difficult  to tell how much of this is psychiatric in nature versus true side  effects.   PAST PSYCHIATRIC HISTORY:  She was admitted December 29.  She she was  discharged on January 7.  She appeared to have completed her detoxed by  the 10th day.  She was doing well on a combination of Risperdal and  Neurontin.  Her schedule was Risperdal 0.25 mg t.i.d. and 0.5 at h.s.,  Reglan 5 mg q.i.d., and Neurontin 200 mg t.i.d.  She is currently only  taking the Reglan, although she states that the Reglan also causes her  to be anxious, but she cannot do without it.  She is followed on the  outside by Dr. Donell Beers.   SOCIAL HISTORY:  She has a GED.  She has been married once.  She has a  daughter, 25; a son,  9.  She is not employed.   FAMILY HISTORY:  Her daughter has substance abuse.  Father has  alcoholism.  She smokes 1 pack a day.   PRIMARY CARE Isiaih Hollenbach:  Dr. Patrica Duel.   PSYCHIATRIST:  Dr. Donell Beers.   MEDICAL PROBLEMS:  She is followed for emphysema, back pain, and gastric  emptying issues.   MEDICATIONS AT THE TIME OF DISCHARGE 3 DAYS AGO:  1. She was discharged on Risperdal 0.25 mg p.o. t.i.d. and 0.5 at      bedtime.  2. Reglan 5 mg q.i.d.  3. Neurontin 200 mg t.i.d.  4. She also reports using Proventil nebulizers p.r.n., and she states      that her primary care physician prescribed Seroquel 50 mg p.o.      daily for her yesterday.   DRUG ALLERGIES:  No known drug allergies.  Large doses of IBUPROFEN  causes gastric distress.   POSITIVE PHYSICAL FINDINGS:  Her vital signs on admission show that her  weight is 106.  Her height is 5 feet 2 inches.  Temperature is 98, blood  pressure is 125/76, pulse is 87.  There is no cyst, and she complains of  chronic back pain and also left sciatic nerve pain.  She is status post  a hysterectomy.  She is known to have COPD.  We can check her weight  from when she was admitted a week ago and make sure she was not having  any swelling in her legs and feet.   MENTAL STATUS EXAM:  She is alert and oriented x3.  She is appropriately  groomed, dressed, and appears to be somewhat thin.  Her speech is not  pressured.  Her mood is depressed.  Her affect is not congruent with the  amount of distress that she reports.  Her thought processes are clear,  rational, and goal oriented.  She wants to feel better.  Judgment and  insight are poor.  Concentration and memory are intact.  She denies  being suicidal or homicidal.  She denies auditory visual hallucinations.   DIAGNOSES:  AXIS I:  Benzodiazepines dependence, mood disorder NOS.  AXIS II:  Deferred.  AXIS III:  Gastric emptying issues and chronic pain.  AXIS IV:  Severe.  AXIS V:   40.   PLAN:  Admit for stabilization.  We will adjust her medications as  indicated.  We will try and get her gastroenterology records from  [facility].  She was restarted on some Vistaril 25 mg p.o. q.4 h and of  note, she did not have any tremor today, although her gastric sounds  were audible in the room.      Mickie Leonarda Salon, P.A.-C.      Anselm Jungling, MD  Electronically Signed    MD/MEDQ  D:  04/21/2006  T:  04/23/2006  Job:  [id number]

## 2010-08-26 NOTE — Discharge Summary (Signed)
NAMEBELLAH, Suzanne Walker NO.:  [id number]   MEDICAL RECORD NO.:  [id number]          PATIENT TYPE:  IPS   LOCATION:  0304                          FACILITY:  [facility]   PHYSICIAN:  Anselm Jungling, MD  DATE OF BIRTH:  12/27/1950   DATE OF ADMISSION:  04/07/2006  DATE OF DISCHARGE:  04/16/2006                               DISCHARGE SUMMARY   IDENTIFYING DATA AND REASON FOR ADMISSION:  This was a voluntary  admission for Suzanne Walker, a 60 year old married female who was admitted due  to major depressive disorder with suicide attempt by overdose on  prescription medication.  Please refer to the admission note for further  details pertaining to the symptoms, circumstances and history that led  to her hospitalization.  She was given an initial Axis I diagnosis of  major depressive disorder, recurrent.   MEDICAL AND LABORATORY:  The patient came to [place] with a history of  emphysema, back pain, and gastric issues.  She was continued on Reglan 5  mg four times daily, and for chronic pain, she was given a trial of  Neurontin, which was helpful at 200 mg t.i.d.  There were no acute  medical issues.   HOSPITAL COURSE:  The patient was admitted to the adult inpatient  psychiatric service.  She presented as a thin female who was quite  anxious.  We made a determination that the patient appeared to be  dependent upon temazepam, and because of this, she was placed on a  detoxification protocol.  She had some difficulty accepting the  necessity for this, but eventually did.  In addition to a Librium  detoxification protocol, she was also given low-dose Risperdal up to 0.5  mg per dose, up to four times daily, with good results.  She  participated in various therapeutic groups and activities and was a  better participant in the program as her stay progressed.   On the fifth hospital day there was a family session involving her  husband.  Husband was quite appropriate and supportive.   They talked  about aftercare needs.   The patient was discharged on the 10th hospital day.  At that time she  appeared to have completed her detoxification process and was doing well  on a combination of Risperdal and Neurontin.  She agreed to the  following aftercare plan.   AFTERCARE:  The patient was to follow-up with Dr. Donell Beers, her usual  psychiatrist with an appointment on 05/07/2006.  She was also to follow-  up with a therapist that she was assigned to on 04/25/2006.   DISCHARGE MEDICATIONS:  Risperdal 0.25 mg t.i.d. and 0.5 mg q.h.s.,  Reglan 5 mg four times daily, Neurontin 200 mg t.i.d.   DISCHARGE DIAGNOSES:  AXIS I:  1. Benzodiazepine dependence.  2. Mood disorder not otherwise specified.  AXIS II: Deferred.  AXIS III:  1. History of gastrointestinal and gastric issues.  2. Gastric emptying defect.  3. Emphysema.  4. Chronic pain.  AXIS IV: Stressors severe.  AXIS V: GAF on discharge 65.      Anselm Jungling, MD  Electronically Signed     SPB/MEDQ  D:  04/18/2006  T:  04/18/2006  Job:  [id number]

## 2011-03-29 ENCOUNTER — Encounter: Payer: Self-pay | Admitting: Gastroenterology

## 2011-04-13 ENCOUNTER — Encounter: Payer: Self-pay | Admitting: Gastroenterology

## 2011-04-26 ENCOUNTER — Encounter: Payer: Self-pay | Admitting: Gastroenterology

## 2011-04-27 ENCOUNTER — Ambulatory Visit: Payer: Self-pay | Admitting: Gastroenterology

## 2011-05-09 ENCOUNTER — Encounter: Payer: Self-pay | Admitting: Gastroenterology

## 2011-05-09 ENCOUNTER — Ambulatory Visit (INDEPENDENT_AMBULATORY_CARE_PROVIDER_SITE_OTHER): Payer: Self-pay | Admitting: Gastroenterology

## 2011-05-09 VITALS — BP 116/70 | HR 100 | Temp 98.5°F | Ht 62.0 in | Wt 159.4 lb

## 2011-05-09 DIAGNOSIS — K3184 Gastroparesis: Secondary | ICD-10-CM

## 2011-05-09 DIAGNOSIS — D369 Benign neoplasm, unspecified site: Secondary | ICD-10-CM

## 2011-05-09 MED ORDER — PROMETHAZINE HCL 25 MG PO TABS: 25.0000 mg | ORAL_TABLET | Freq: Four times a day (QID) | ORAL | Status: DC | PRN

## 2011-05-09 MED ORDER — ONDANSETRON HCL 4 MG PO TABS: 4.0000 mg | ORAL_TABLET | Freq: Three times a day (TID) | ORAL | Status: AC | PRN

## 2011-05-09 NOTE — Patient Instructions (Signed)
Let's try to wean off the Reglan. Start by only taking the lunch time dose daily for 1 week. In the evening, you may take Zofran or Phenergan. The next week, take Reglan only once every other day. You may adjust this tapering as needed, but the goal is to come off Reglan completely in the next few weeks to a month.   Follow the gastroparesis diet and avoid dairy products. You may take imodium as needed. Make sure you are taking the supplemental fiber.  We will see you back in 1 year!

## 2011-05-09 NOTE — Progress Notes (Signed)
Referring Provider: No ref. provider found Primary Care Physician:  Cassell Smiles., MD, MD Primary Gastroenterologist: Dr. Darrick Penna   Chief Complaint  Patient presents with  . Follow-up    yearly    HPI:   Returns today for yearly follow-up, hx of gastroparesis. Wt actually up 7 lbs. States was even heavier around Christmas at 164. Taking Reglan twice a day. Tried to stop but starts back to throwing up. Intermittent, rare reflux. Not all the time.  BMs vary. One day diarrhea, one day normal. Can never predict. No blood in stool. Up 7 lbs. Intermittent abdominal discomfort no real relation to anything. Diarrhea usually related to fatty, greasy foods.   No melena or hematochezia. Last TCS in 2009, question if due for surveillance due to hx of simple adenomas. Paper chart states 2019. Will need to d/w Dr. Darrick Penna.   Past Medical History  Diagnosis Date  . Gastroparesis   . Anxiety disorder   . COPD (chronic obstructive pulmonary disease)   . Depression   . Hyperlipidemia     Past Surgical History  Procedure Date  . Tonsillectomy   . Tubal ligation   . S/p hysterectomy   . Cyst removed     from finger  . Back surgery     x 2  . Colonoscopy 10/10/07    adenomatous polyps    Current Outpatient Prescriptions  Medication Sig Dispense Refill  . chlordiazePOXIDE (LIBRIUM) 10 MG capsule Take 10 mg by mouth 2 (two) times daily.       . Docosanol (ABREVA) 10 % CREA Apply topically.        . DULoxetine (CYMBALTA) 60 MG capsule Take 60 mg by mouth daily.        . flunisolide (NASAREL) 29 MCG/ACT (0.025%) nasal spray 2 sprays by Nasal route 2 (two) times daily. Dose is for each nostril.       . Glycerin-Hypromellose-PEG 400 (VISINE TEARS OP) Apply to eye.        Marland Kitchen ipratropium-albuterol (DUONEB) 0.5-2.5 (3) MG/3ML SOLN Take 3 mLs by nebulization.        . metoCLOPramide (REGLAN) 5 MG tablet Take 5 mg by mouth 4 (four) times daily.        . promethazine (PHENERGAN) 25 MG tablet Take 1  tablet (25 mg total) by mouth every 6 (six) hours as needed.  30 tablet  11  . Wheat Dextrin (BENEFIBER DRINK MIX) PACK Take by mouth.        . zolpidem (AMBIEN) 10 MG tablet Take 10 mg by mouth at bedtime as needed.        Marland Kitchen albuterol (PROVENTIL HFA) 108 (90 BASE) MCG/ACT inhaler Inhale 2 puffs into the lungs every 6 (six) hours as needed.        . ondansetron (ZOFRAN) 4 MG tablet Take 1 tablet (4 mg total) by mouth every 8 (eight) hours as needed for nausea.  90 tablet  11    Allergies as of 05/09/2011 - Review Complete 05/09/2011  Allergen Reaction Noted  . Ibuprofen      History   Social History  . Marital Status: Married    Spouse Name: N/A    Number of Children: N/A  . Years of Education: N/A   Social History Main Topics  . Smoking status: Former Smoker -- 0.5 packs/day    Types: Cigarettes  . Smokeless tobacco: None  . Alcohol Use: No  . Drug Use: No  . Sexually Active: None   Other Topics  Concern  . None   Social History Narrative  . None    Review of Systems: Gen: Denies fever, chills, anorexia. Denies fatigue, weakness, weight loss.  CV: Denies chest pain, palpitations, syncope, peripheral edema, and claudication. Resp: Denies dyspnea at rest, cough, wheezing, coughing up blood, and pleurisy. GI: Denies vomiting blood, jaundice, and fecal incontinence.   Denies dysphagia or odynophagia. Derm: Denies rash, itching, dry skin Psych: Denies depression, anxiety, memory loss, confusion. No homicidal or suicidal ideation.  Heme: Denies bruising, bleeding, and enlarged lymph nodes.  Physical Exam: BP 116/70  Pulse 100  Temp(Src) 98.5 F (36.9 C) (Temporal)  Ht 5\' 2"  (1.575 m)  Wt 159 lb 6.4 oz (72.303 kg)  BMI 29.15 kg/m2 General:   Alert and oriented. No distress noted. Pleasant and cooperative.  Head:  Normocephalic and atraumatic. Eyes:  Conjuctiva clear without scleral icterus. Mouth:  Oral mucosa pink and moist. Good dentition. No lesions. Neck:  Supple,  without mass or thyromegaly. Heart:  S1, S2 present without murmurs, rubs, or gallops. Regular rate and rhythm. Abdomen:  +BS, soft, non-tender and non-distended. No rebound or guarding. No HSM or masses noted. Msk:  Symmetrical without gross deformities. Normal posture. Extremities:  Without edema. Neurologic:  Alert and  oriented x4;  grossly normal neurologically. Skin:  Intact without significant lesions or rashes. Cervical Nodes:  No significant cervical adenopathy. Psych:  Alert and cooperative. Normal mood and affect.

## 2011-05-10 DIAGNOSIS — D369 Benign neoplasm, unspecified site: Secondary | ICD-10-CM | POA: Insufficient documentation

## 2011-05-10 NOTE — Progress Notes (Signed)
Faxed to PCP

## 2011-05-10 NOTE — Assessment & Plan Note (Signed)
Maintained on Reglan for some time now. Pt has tapered on her own to just BID. Discussed tapering off this completely and following gastroparesis diet, as she is not currently doing so. Would rather avoid long-term use of Reglan in this individual due to age and potential side effects. She is agreeable to this. Discussed dropping down to Reglan once daily for a week or so, then every other day until weaned off. Institute Zofran prn as needed. Pt already has prescription for Phenergan. FOLLOW gastroparesis diet.   Follow-up in 1 year or sooner if needed Zofran rx provided Gastroparesis diet Attempt to wean off Reglan

## 2011-05-10 NOTE — Assessment & Plan Note (Signed)
Hx of simple adenoma in 2009. No lower GI symptoms currently. She does not intermittent diarrhea if she eats fatty/greasy foods. No rectal bleeding. Paper chart states surveillance due 2019. Will discuss with Dr. Darrick Penna if needed now or wait till 2019. Pt aware.

## 2011-05-11 NOTE — Progress Notes (Signed)
REVIEWED.  

## 2011-05-11 NOTE — Progress Notes (Signed)
TCS 2019 SIMPLE ADENOMAS REMOVED IN 2009

## 2012-03-28 ENCOUNTER — Encounter: Payer: Self-pay | Admitting: *Deleted

## 2013-01-16 DIAGNOSIS — F341 Dysthymic disorder: Secondary | ICD-10-CM | POA: Insufficient documentation

## 2013-01-16 DIAGNOSIS — F329 Major depressive disorder, single episode, unspecified: Secondary | ICD-10-CM | POA: Insufficient documentation

## 2013-01-16 DIAGNOSIS — F609 Personality disorder, unspecified: Secondary | ICD-10-CM | POA: Insufficient documentation

## 2013-01-22 ENCOUNTER — Emergency Department (HOSPITAL_COMMUNITY): Payer: Self-pay

## 2013-01-22 ENCOUNTER — Encounter (HOSPITAL_COMMUNITY)
Admission: EM | Disposition: A | Discharge status: Home / Self Care | Payer: Self-pay | Source: Home / Self Care | Attending: General Surgery

## 2013-01-22 ENCOUNTER — Encounter (HOSPITAL_COMMUNITY): Payer: Self-pay | Admitting: Emergency Medicine

## 2013-01-22 ENCOUNTER — Inpatient Hospital Stay (HOSPITAL_COMMUNITY): Payer: Self-pay | Admitting: Anesthesiology

## 2013-01-22 ENCOUNTER — Inpatient Hospital Stay (HOSPITAL_COMMUNITY)
Admission: EM | Admit: 2013-01-22 | Discharge: 2013-01-28 | DRG: 339 | Disposition: A | Discharge status: Home / Self Care | Payer: Self-pay | Attending: General Surgery | Admitting: General Surgery

## 2013-01-22 ENCOUNTER — Encounter (HOSPITAL_COMMUNITY): Payer: Self-pay | Admitting: Anesthesiology

## 2013-01-22 DIAGNOSIS — F411 Generalized anxiety disorder: Secondary | ICD-10-CM | POA: Diagnosis present

## 2013-01-22 DIAGNOSIS — K3184 Gastroparesis: Secondary | ICD-10-CM | POA: Diagnosis present

## 2013-01-22 DIAGNOSIS — J441 Chronic obstructive pulmonary disease with (acute) exacerbation: Secondary | ICD-10-CM | POA: Diagnosis not present

## 2013-01-22 DIAGNOSIS — K352 Acute appendicitis with generalized peritonitis, without abscess: Principal | ICD-10-CM | POA: Diagnosis present

## 2013-01-22 DIAGNOSIS — F3289 Other specified depressive episodes: Secondary | ICD-10-CM | POA: Diagnosis present

## 2013-01-22 DIAGNOSIS — K37 Unspecified appendicitis: Secondary | ICD-10-CM

## 2013-01-22 DIAGNOSIS — E876 Hypokalemia: Secondary | ICD-10-CM | POA: Diagnosis not present

## 2013-01-22 DIAGNOSIS — K219 Gastro-esophageal reflux disease without esophagitis: Secondary | ICD-10-CM | POA: Diagnosis present

## 2013-01-22 DIAGNOSIS — F329 Major depressive disorder, single episode, unspecified: Secondary | ICD-10-CM | POA: Diagnosis present

## 2013-01-22 DIAGNOSIS — Z87891 Personal history of nicotine dependence: Secondary | ICD-10-CM

## 2013-01-22 DIAGNOSIS — Z23 Encounter for immunization: Secondary | ICD-10-CM

## 2013-01-22 DIAGNOSIS — E785 Hyperlipidemia, unspecified: Secondary | ICD-10-CM | POA: Diagnosis present

## 2013-01-22 DIAGNOSIS — K35209 Acute appendicitis with generalized peritonitis, without abscess, unspecified as to perforation: Principal | ICD-10-CM | POA: Diagnosis present

## 2013-01-22 HISTORY — PX: LAPAROSCOPIC APPENDECTOMY: SHX408

## 2013-01-22 LAB — BASIC METABOLIC PANEL
CO2: 28 mEq/L (ref 19–32)
Calcium: 9.5 mg/dL (ref 8.4–10.5)
Creatinine, Ser: 0.74 mg/dL (ref 0.50–1.10)
GFR calc Af Amer: >90 mL/min (ref >90)
Potassium: 3.3 mEq/L — ABNORMAL LOW (ref 3.5–5.1)
Sodium: 137 mEq/L (ref 135–145)

## 2013-01-22 LAB — CBC WITH DIFFERENTIAL/PLATELET
Basophils Absolute: 0 10*3/uL (ref 0.0–0.1)
Basophils Relative: 0 % (ref 0–1)
Lymphocytes Relative: 6 % — ABNORMAL LOW (ref 12–46)
MCHC: 34.9 g/dL (ref 30.0–36.0)
Monocytes Absolute: 1.6 10*3/uL — ABNORMAL HIGH (ref 0.1–1.0)
Monocytes Relative: 9 % (ref 3–12)
Platelets: 253 10*3/uL (ref 150–400)
RBC: 4.77 MIL/uL (ref 3.87–5.11)
WBC: 18 10*3/uL — ABNORMAL HIGH (ref 4.0–10.5)

## 2013-01-22 LAB — HEPATIC FUNCTION PANEL: Albumin: 4.2 g/dL (ref 3.5–5.2)

## 2013-01-22 LAB — SURGICAL PCR SCREEN: MRSA, PCR: NEGATIVE

## 2013-01-22 LAB — LIPASE, BLOOD: Lipase: 13 U/L (ref 11–59)

## 2013-01-22 SURGERY — APPENDECTOMY, LAPAROSCOPIC
Anesthesia: General | Site: Abdomen | Wound class: Dirty or Infected

## 2013-01-22 MED ORDER — PROPOFOL 10 MG/ML IV BOLUS
INTRAVENOUS | Status: DC | PRN
  Administered 2013-01-22: 120 mg via INTRAVENOUS

## 2013-01-22 MED ORDER — ALBUTEROL SULFATE HFA 108 (90 BASE) MCG/ACT IN AERS: 2.0000 | INHALATION_SPRAY | Freq: Four times a day (QID) | RESPIRATORY_TRACT | Status: DC | PRN

## 2013-01-22 MED ORDER — SUCCINYLCHOLINE CHLORIDE 20 MG/ML IJ SOLN
INTRAMUSCULAR | Status: DC | PRN
  Administered 2013-01-22: 150 mg via INTRAVENOUS

## 2013-01-22 MED ORDER — ROCURONIUM BROMIDE 100 MG/10ML IV SOLN
INTRAVENOUS | Status: DC | PRN
  Administered 2013-01-22: 30 mg via INTRAVENOUS

## 2013-01-22 MED ORDER — IOHEXOL 300 MG/ML  SOLN
50.0000 mL | Freq: Once | INTRAMUSCULAR | Status: AC | PRN
  Administered 2013-01-22: 50 mL via ORAL

## 2013-01-22 MED ORDER — IPRATROPIUM BROMIDE 0.02 % IN SOLN: 0.5000 mg | Freq: Four times a day (QID) | RESPIRATORY_TRACT | Status: DC | PRN

## 2013-01-22 MED ORDER — ENOXAPARIN SODIUM 40 MG/0.4ML ~~LOC~~ SOLN
SUBCUTANEOUS | Status: AC
  Filled 2013-01-22: qty 0.4

## 2013-01-22 MED ORDER — FENTANYL CITRATE 0.05 MG/ML IJ SOLN
INTRAMUSCULAR | Status: AC
  Filled 2013-01-22: qty 5

## 2013-01-22 MED ORDER — BUPIVACAINE HCL (PF) 0.5 % IJ SOLN
INTRAMUSCULAR | Status: AC
  Filled 2013-01-22: qty 30

## 2013-01-22 MED ORDER — SUCCINYLCHOLINE CHLORIDE 20 MG/ML IJ SOLN
INTRAMUSCULAR | Status: AC
  Filled 2013-01-22: qty 1

## 2013-01-22 MED ORDER — PIPERACILLIN-TAZOBACTAM 3.375 G IVPB
INTRAVENOUS | Status: AC
  Filled 2013-01-22: qty 100

## 2013-01-22 MED ORDER — ALBUTEROL SULFATE (5 MG/ML) 0.5% IN NEBU: 2.5000 mg | INHALATION_SOLUTION | Freq: Four times a day (QID) | RESPIRATORY_TRACT | Status: DC | PRN

## 2013-01-22 MED ORDER — DEXAMETHASONE SODIUM PHOSPHATE 10 MG/ML IJ SOLN
INTRAMUSCULAR | Status: DC | PRN
  Administered 2013-01-22: 4 mg via INTRAVENOUS

## 2013-01-22 MED ORDER — ONDANSETRON HCL 4 MG/2ML IJ SOLN
4.0000 mg | Freq: Once | INTRAMUSCULAR | Status: AC
  Administered 2013-01-22: 4 mg via INTRAVENOUS
  Filled 2013-01-22: qty 2

## 2013-01-22 MED ORDER — ONDANSETRON HCL 4 MG/2ML IJ SOLN
INTRAMUSCULAR | Status: AC
  Filled 2013-01-22: qty 2

## 2013-01-22 MED ORDER — BIOTENE DRY MOUTH MT LIQD: 15.0000 mL | Freq: Two times a day (BID) | OROMUCOSAL | Status: DC

## 2013-01-22 MED ORDER — PNEUMOCOCCAL VAC POLYVALENT 25 MCG/0.5ML IJ INJ
0.5000 mL | INJECTION | INTRAMUSCULAR | Status: AC
  Administered 2013-01-23: 0.5 mL via INTRAMUSCULAR
  Filled 2013-01-22: qty 0.5

## 2013-01-22 MED ORDER — LACTATED RINGERS IV SOLN
INTRAVENOUS | Status: DC | PRN
  Administered 2013-01-22: 17:00:00 via INTRAVENOUS

## 2013-01-22 MED ORDER — IPRATROPIUM-ALBUTEROL 0.5-2.5 (3) MG/3ML IN SOLN: 3.0000 mL | Freq: Four times a day (QID) | RESPIRATORY_TRACT | Status: DC | PRN

## 2013-01-22 MED ORDER — ONDANSETRON HCL 4 MG/2ML IJ SOLN
4.0000 mg | Freq: Three times a day (TID) | INTRAMUSCULAR | Status: AC | PRN
  Administered 2013-01-22 (×2): 4 mg via INTRAVENOUS
  Filled 2013-01-22 (×2): qty 2

## 2013-01-22 MED ORDER — ENOXAPARIN SODIUM 40 MG/0.4ML ~~LOC~~ SOLN
40.0000 mg | SUBCUTANEOUS | Status: DC
  Administered 2013-01-22 – 2013-01-27 (×6): 40 mg via SUBCUTANEOUS
  Filled 2013-01-22 (×6): qty 0.4

## 2013-01-22 MED ORDER — PIPERACILLIN-TAZOBACTAM 3.375 G IVPB
3.3750 g | Freq: Three times a day (TID) | INTRAVENOUS | Status: DC
  Administered 2013-01-22 – 2013-01-28 (×17): 3.375 g via INTRAVENOUS
  Filled 2013-01-22 (×18): qty 50

## 2013-01-22 MED ORDER — DEXAMETHASONE SODIUM PHOSPHATE 4 MG/ML IJ SOLN
INTRAMUSCULAR | Status: AC
  Filled 2013-01-22: qty 1

## 2013-01-22 MED ORDER — PIPERACILLIN-TAZOBACTAM 3.375 G IVPB
3.3750 g | Freq: Once | INTRAVENOUS | Status: AC
  Administered 2013-01-22: 3.375 g via INTRAVENOUS
  Filled 2013-01-22: qty 50

## 2013-01-22 MED ORDER — FENTANYL CITRATE 0.05 MG/ML IJ SOLN
INTRAMUSCULAR | Status: DC | PRN
  Administered 2013-01-22: 75 ug via INTRAVENOUS
  Administered 2013-01-22: 50 ug via INTRAVENOUS
  Administered 2013-01-22: 25 ug via INTRAVENOUS

## 2013-01-22 MED ORDER — SODIUM CHLORIDE 0.9 % IR SOLN
Status: DC | PRN
  Administered 2013-01-22: 3000 mL

## 2013-01-22 MED ORDER — MIDAZOLAM HCL 5 MG/5ML IJ SOLN
INTRAMUSCULAR | Status: DC | PRN
  Administered 2013-01-22: 2 mg via INTRAVENOUS

## 2013-01-22 MED ORDER — LACTATED RINGERS IV SOLN
INTRAVENOUS | Status: DC
  Administered 2013-01-22 – 2013-01-27 (×6): via INTRAVENOUS

## 2013-01-22 MED ORDER — PANTOPRAZOLE SODIUM 40 MG IV SOLR
40.0000 mg | Freq: Every day | INTRAVENOUS | Status: DC
  Administered 2013-01-22 – 2013-01-24 (×3): 40 mg via INTRAVENOUS
  Filled 2013-01-22 (×3): qty 40

## 2013-01-22 MED ORDER — SODIUM CHLORIDE 0.9 % IV SOLN
INTRAVENOUS | Status: AC
  Administered 2013-01-22: 15:00:00 via INTRAVENOUS

## 2013-01-22 MED ORDER — SODIUM CHLORIDE 0.9 % IR SOLN
Status: DC | PRN
  Administered 2013-01-22: 1000 mL

## 2013-01-22 MED ORDER — NEOSTIGMINE METHYLSULFATE 1 MG/ML IJ SOLN
INTRAMUSCULAR | Status: DC | PRN
  Administered 2013-01-22 (×3): 1 mg via INTRAVENOUS

## 2013-01-22 MED ORDER — ONDANSETRON HCL 4 MG/2ML IJ SOLN
INTRAMUSCULAR | Status: DC | PRN
  Administered 2013-01-22: 4 mg via INTRAMUSCULAR

## 2013-01-22 MED ORDER — HYDROMORPHONE HCL PF 1 MG/ML IJ SOLN
1.0000 mg | INTRAMUSCULAR | Status: AC | PRN
  Administered 2013-01-22 – 2013-01-23 (×3): 1 mg via INTRAVENOUS
  Filled 2013-01-22 (×4): qty 1

## 2013-01-22 MED ORDER — LIDOCAINE HCL 1 % IJ SOLN
INTRAMUSCULAR | Status: DC | PRN
  Administered 2013-01-22: 50 mg via INTRADERMAL

## 2013-01-22 MED ORDER — IOHEXOL 300 MG/ML  SOLN
100.0000 mL | Freq: Once | INTRAMUSCULAR | Status: AC | PRN
  Administered 2013-01-22: 100 mL via INTRAVENOUS

## 2013-01-22 MED ORDER — INFLUENZA VAC SPLIT QUAD 0.5 ML IM SUSP
0.5000 mL | INTRAMUSCULAR | Status: AC
  Administered 2013-01-23: 0.5 mL via INTRAMUSCULAR
  Filled 2013-01-22: qty 0.5

## 2013-01-22 MED ORDER — BUPIVACAINE HCL 0.5 % IJ SOLN
INTRAMUSCULAR | Status: DC | PRN
  Administered 2013-01-22: 10 mL

## 2013-01-22 MED ORDER — HYDROMORPHONE HCL PF 1 MG/ML IJ SOLN
1.0000 mg | Freq: Once | INTRAMUSCULAR | Status: AC
  Administered 2013-01-22: 1 mg via INTRAVENOUS
  Filled 2013-01-22: qty 1

## 2013-01-22 MED ORDER — MIDAZOLAM HCL 2 MG/2ML IJ SOLN
INTRAMUSCULAR | Status: AC
  Filled 2013-01-22: qty 2

## 2013-01-22 SURGICAL SUPPLY — 53 items
BAG HAMPER (MISCELLANEOUS) ×2 IMPLANT
BENZOIN TINCTURE PRP APPL 2/3 (GAUZE/BANDAGES/DRESSINGS) ×2 IMPLANT
CLOTH BEACON ORANGE TIMEOUT ST (SAFETY) ×2 IMPLANT
COVER LIGHT HANDLE STERIS (MISCELLANEOUS) ×4 IMPLANT
CUTTER FLEX LINEAR 45M (STAPLE) ×2 IMPLANT
CUTTER LINEAR ENDO 35 ETS (STAPLE) ×2 IMPLANT
DECANTER SPIKE VIAL GLASS SM (MISCELLANEOUS) ×2 IMPLANT
DEVICE TROCAR PUNCTURE CLOSURE (ENDOMECHANICALS) ×2 IMPLANT
DURAPREP 26ML APPLICATOR (WOUND CARE) ×2 IMPLANT
ELECT REM PT RETURN 9FT ADLT (ELECTROSURGICAL) ×2
ELECTRODE REM PT RTRN 9FT ADLT (ELECTROSURGICAL) ×1 IMPLANT
EVACUATOR DRAINAGE 10X20 100CC (DRAIN) ×1 IMPLANT
EVACUATOR SILICONE 100CC (DRAIN) ×1
FILTER SMOKE EVAC LAPAROSHD (FILTER) ×2 IMPLANT
FORMALIN 10 PREFIL 120ML (MISCELLANEOUS) ×2 IMPLANT
GLOVE BIOGEL PI IND STRL 6.5 (GLOVE) ×1 IMPLANT
GLOVE BIOGEL PI IND STRL 7.0 (GLOVE) ×1 IMPLANT
GLOVE BIOGEL PI IND STRL 7.5 (GLOVE) ×1 IMPLANT
GLOVE BIOGEL PI INDICATOR 6.5 (GLOVE) ×1
GLOVE BIOGEL PI INDICATOR 7.0 (GLOVE) ×1
GLOVE BIOGEL PI INDICATOR 7.5 (GLOVE) ×1
GLOVE ECLIPSE 6.5 STRL STRAW (GLOVE) ×2 IMPLANT
GLOVE ECLIPSE 7.0 STRL STRAW (GLOVE) ×2 IMPLANT
GOWN STRL REIN XL XLG (GOWN DISPOSABLE) ×4 IMPLANT
INST SET LAPROSCOPIC AP (KITS) ×2 IMPLANT
IV NS IRRIG 3000ML ARTHROMATIC (IV SOLUTION) ×2 IMPLANT
KIT ROOM TURNOVER APOR (KITS) ×2 IMPLANT
MANIFOLD NEPTUNE II (INSTRUMENTS) ×2 IMPLANT
NEEDLE INSUFFLATION 14GA 120MM (NEEDLE) ×2 IMPLANT
NS IRRIG 1000ML POUR BTL (IV SOLUTION) ×2 IMPLANT
PACK LAP CHOLE LZT030E (CUSTOM PROCEDURE TRAY) ×2 IMPLANT
PAD ARMBOARD 7.5X6 YLW CONV (MISCELLANEOUS) ×2 IMPLANT
POUCH SPECIMEN RETRIEVAL 10MM (ENDOMECHANICALS) ×2 IMPLANT
RELOAD 45 VASCULAR/THIN (ENDOMECHANICALS) IMPLANT
RELOAD STAPLE TA45 3.5 REG BLU (ENDOMECHANICALS) IMPLANT
SEALER TISSUE G2 CVD JAW 35 (ENDOMECHANICALS) ×1 IMPLANT
SEALER TISSUE G2 CVD JAW 45CM (ENDOMECHANICALS) ×1
SET BASIN LINEN APH (SET/KITS/TRAYS/PACK) ×2 IMPLANT
SET TUBE IRRIG SUCTION NO TIP (IRRIGATION / IRRIGATOR) ×2 IMPLANT
SPONGE DRAIN TRACH 4X4 STRL 2S (GAUZE/BANDAGES/DRESSINGS) ×2 IMPLANT
SPONGE GAUZE 2X2 8PLY STRL LF (GAUZE/BANDAGES/DRESSINGS) ×6 IMPLANT
STAPLER VISISTAT (STAPLE) ×2 IMPLANT
STRIP CLOSURE SKIN 1/2X4 (GAUZE/BANDAGES/DRESSINGS) ×2 IMPLANT
SUT ETHILON 3 0 FSL (SUTURE) ×2 IMPLANT
SUT MNCRL AB 4-0 PS2 18 (SUTURE) ×2 IMPLANT
SUT VIC AB 2-0 CT2 27 (SUTURE) ×2 IMPLANT
TAPE CLOTH SURG 4X10 WHT LF (GAUZE/BANDAGES/DRESSINGS) ×2 IMPLANT
TRAY FOLEY CATH 16FR SILVER (SET/KITS/TRAYS/PACK) ×2 IMPLANT
TROCAR ENDO BLADELESS 11MM (ENDOMECHANICALS) ×2 IMPLANT
TROCAR ENDO BLADELESS 12MM (ENDOMECHANICALS) ×2 IMPLANT
TROCAR XCEL NON-BLD 5MMX100MML (ENDOMECHANICALS) ×2 IMPLANT
TUBING INSUFFLATION (TUBING) ×2 IMPLANT
WARMER LAPAROSCOPE (MISCELLANEOUS) ×2 IMPLANT

## 2013-01-22 NOTE — H&P (Signed)
Suzanne Walker is an 62 y.o. female.   Chief Complaint: Right lower quadrant pain HPI: Patient presented to [facility] with less than 24 hours of gradually increasing right lower quadrant pain. Pain is not significantly radiated. It is exacerbated with palpation and movement. Appetite has been suppressed. Last meal was yesterday. She has had some nausea but no emesis. Subjective fevers and chills. No significant change in bowel movements. No unusual contacts or exposures. No sick contacts. No similar symptomatology in the past.  Past Medical History  Diagnosis Date  . Gastroparesis   . Anxiety disorder   . COPD (chronic obstructive pulmonary disease)   . Depression   . Hyperlipidemia   . GERD (gastroesophageal reflux disease)     Past Surgical History  Procedure Laterality Date  . Tonsillectomy    . Tubal ligation    . S/p hysterectomy    . Cyst removed      from finger  . Back surgery      x 2  . Colonoscopy  10/10/07    adenomatous polyps    No family history on file. Social History:  reports that she has quit smoking. Her smoking use included Cigarettes. She smoked 0.50 packs per day. She does not have any smokeless tobacco history on file. She reports that she does not drink alcohol or use illicit drugs.  Allergies:  Allergies  Allergen Reactions  . Ibuprofen     REACTION: Unknown reaction    Medications Prior to Admission  Medication Sig Dispense Refill  . DULoxetine (CYMBALTA) 60 MG capsule Take 60 mg by mouth daily.        Marland Kitchen albuterol (PROVENTIL HFA) 108 (90 BASE) MCG/ACT inhaler Inhale 2 puffs into the lungs every 6 (six) hours as needed.        . chlordiazePOXIDE (LIBRIUM) 10 MG capsule Take 10 mg by mouth 2 (two) times daily.       . Docosanol (ABREVA) 10 % CREA Apply topically.        . flunisolide (NASAREL) 29 MCG/ACT (0.025%) nasal spray 2 sprays by Nasal route 2 (two) times daily. Dose is for each nostril.       . Glycerin-Hypromellose-PEG 400 (VISINE  TEARS OP) Apply to eye.        Marland Kitchen ipratropium-albuterol (DUONEB) 0.5-2.5 (3) MG/3ML SOLN Take 3 mLs by nebulization.        . metoCLOPramide (REGLAN) 5 MG tablet Take 5 mg by mouth 4 (four) times daily.        . promethazine (PHENERGAN) 25 MG tablet Take 1 tablet (25 mg total) by mouth every 6 (six) hours as needed.  30 tablet  11  . Wheat Dextrin (BENEFIBER DRINK MIX) PACK Take by mouth.        . zolpidem (AMBIEN) 10 MG tablet Take 10 mg by mouth at bedtime as needed.          Results for orders placed during the hospital encounter of 01/22/13 (from the past 48 hour(s))  CBC WITH DIFFERENTIAL     Status: Abnormal   Collection Time    01/22/13 10:45 AM      Result Value Range   WBC 18.0 (*) 4.0 - 10.5 K/uL   RBC 4.77  3.87 - 5.11 MIL/uL   Hemoglobin 14.3  12.0 - 15.0 g/dL   HCT 09.8  11.9 - 14.7 %   MCV 86.0  78.0 - 100.0 fL   MCH 30.0  26.0 - 34.0 pg  MCHC 34.9  30.0 - 36.0 g/dL   RDW 69.6  29.5 - 28.4 %   Platelets 253  150 - 400 K/uL   Neutrophils Relative % 85 (*) 43 - 77 %   Neutro Abs 15.2 (*) 1.7 - 7.7 K/uL   Lymphocytes Relative 6 (*) 12 - 46 %   Lymphs Abs 1.2  0.7 - 4.0 K/uL   Monocytes Relative 9  3 - 12 %   Monocytes Absolute 1.6 (*) 0.1 - 1.0 K/uL   Eosinophils Relative 0  0 - 5 %   Eosinophils Absolute 0.0  0.0 - 0.7 K/uL   Basophils Relative 0  0 - 1 %   Basophils Absolute 0.0  0.0 - 0.1 K/uL  BASIC METABOLIC PANEL     Status: Abnormal   Collection Time    01/22/13 10:45 AM      Result Value Range   Sodium 137  135 - 145 mEq/L   Potassium 3.3 (*) 3.5 - 5.1 mEq/L   Chloride 96  96 - 112 mEq/L   CO2 28  19 - 32 mEq/L   Glucose, Bld 162 (*) 70 - 99 mg/dL   BUN 7  6 - 23 mg/dL   Creatinine, Ser 1.32  0.50 - 1.10 mg/dL   Calcium 9.5  8.4 - 44.0 mg/dL   GFR calc non Af Amer 89 (*) >90 mL/min   GFR calc Af Amer >90  >90 mL/min   Comment: (NOTE)     The eGFR has been calculated using the CKD EPI equation.     This calculation has not been validated in all  clinical situations.     eGFR's persistently <90 mL/min signify possible Chronic Kidney     Disease.  LIPASE, BLOOD     Status: None   Collection Time    01/22/13 11:23 AM      Result Value Range   Lipase 13  11 - 59 U/L  HEPATIC FUNCTION PANEL     Status: None   Collection Time    01/22/13 11:23 AM      Result Value Range   Total Protein 8.0  6.0 - 8.3 g/dL   Albumin 4.2  3.5 - 5.2 g/dL   AST 13  0 - 37 U/L   ALT 9  0 - 35 U/L   Alkaline Phosphatase 100  39 - 117 U/L   Total Bilirubin 0.9  0.3 - 1.2 mg/dL   Bilirubin, Direct 0.2  0.0 - 0.3 mg/dL   Indirect Bilirubin 0.7  0.3 - 0.9 mg/dL   Ct Abdomen Pelvis W Contrast  01/22/2013   CLINICAL DATA:  Abdominal pain with nausea. Prior hysterectomy.  EXAM: CT ABDOMEN AND PELVIS WITH CONTRAST  TECHNIQUE: Multidetector CT imaging of the abdomen and pelvis was performed using the standard protocol following bolus administration of intravenous contrast.  CONTRAST:  OMNIPAQUE IOHEXOL 300 MG/ML  SOLN  COMPARISON:  CT urogram 10/01/2005.  FINDINGS: There is stable mild scarring in both lung bases. No significant pleural or pericardial effusion is present. The distal esophagus demonstrates circumferential wall thickening.  There is a stable small calcified splenic granuloma. The spleen, liver, gallbladder, biliary system, pancreas, adrenal glands and kidneys demonstrate no significant findings.  The appendix is moderately distended to 12 mm and demonstrates wall thickening and surrounding inflammatory change. The cecum and terminal ileum appear normal. There is no focal extraluminal fluid collection. There are postsurgical changes in the pelvis related to previous hysterectomy. No adnexal  mass is demonstrated. The bladder appears normal. There are scattered vascular calcifications without evidence of aneurysm or large vessel occlusion.  IMPRESSION: 1. Acute appendicitis with periappendiceal inflammatory changes. No specific signs of appendiceal  rupture or abscess. 2. No evidence of bowel obstruction. 3. Prior hysterectomy. 4. Mild distal esophageal wall thickening, possibly related to chronic reflux. These results were called by telephone at the time of interpretation on 01/22/2013 at 12:54 PM to Dr. Bethann Berkshire , who verbally acknowledged these results.   Electronically Signed   By: Roxy Horseman M.D.   On: 01/22/2013 12:55   Dg Chest Port 1 View  01/22/2013   CLINICAL DATA:  Preop appendicitis  EXAM: PORTABLE CHEST - 1 VIEW  COMPARISON:  05/28/2005  FINDINGS: The lungs are clear without infiltrate or effusion. Negative for heart failure or mass lesion.  IMPRESSION: No active disease.   Electronically Signed   By: Marlan Palau M.D.   On: 01/22/2013 13:23    Review of Systems  Constitutional: Positive for fever and chills.  HENT: Negative.   Eyes: Negative.   Respiratory: Positive for shortness of breath (baseline) and wheezing (baseline, no increase).   Cardiovascular: Negative.   Gastrointestinal: Positive for heartburn, nausea and abdominal pain (right lower quadrant). Negative for vomiting, diarrhea, constipation, blood in stool and melena.  Genitourinary: Negative.   Musculoskeletal: Negative.   Skin: Negative.   Neurological: Negative.   Endo/Heme/Allergies: Negative.   Psychiatric/Behavioral: Negative.     Blood pressure 104/61, pulse 114, temperature 99 F (37.2 C), temperature source Oral, resp. rate 19, height 5\' 2"  (1.575 m), weight 72.122 kg (159 lb), SpO2 93.00%. Physical Exam  Constitutional: She is oriented to person, place, and time. She appears well-developed and well-nourished. No distress.  Appears slightly older than stated age.  HENT:  Head: Normocephalic and atraumatic.  Eyes: Conjunctivae and EOM are normal. Pupils are equal, round, and reactive to light.  Neck: Normal range of motion. No tracheal deviation present. No thyromegaly present.  Cardiovascular: Normal rate and regular rhythm.    Respiratory: Effort normal and breath sounds normal. No respiratory distress.  GI: Soft. She exhibits no distension and no mass. There is tenderness (positive right lower quadrant pain at McBurney's point. No diffuse rebound tenderness or peritoneal signs.). There is guarding. There is no rebound.  Musculoskeletal: Normal range of motion.  Lymphadenopathy:    She has no cervical adenopathy.  Neurological: She is alert and oriented to person, place, and time.  Skin: Skin is warm and dry.     Assessment/Plan Acute appendicitis. Risks benefits alternatives of a laparoscopic possible open appendectomy have been discussed with the patient including but not limited to risk of bleeding, infection, appendiceal stump leak, intraoperative cardiac and pulmonary events. At this point she'll be continued n.p.o. status. Continue IV fluid hydration. Continue IV antibiotics with Zosyn. DVT prophylaxis. Will plan to proceed to the operating room for the planned laparoscopic appendectomy. Patient is agreement with this plan.  Bernardo Brayman C 01/22/2013, 5:02 PM

## 2013-01-22 NOTE — Transfer of Care (Signed)
Immediate Anesthesia Transfer of Care Note  Patient: Suzanne Walker  Procedure(s) Performed: Procedure(s): APPENDECTOMY LAPAROSCOPIC (N/A)  Patient Location: PACU  Anesthesia Type:General  Level of Consciousness: awake and patient cooperative  Airway & Oxygen Therapy: Patient Spontanous Breathing and Patient connected to face mask oxygen  Post-op Assessment: Report given to PACU RN, Post -op Vital signs reviewed and stable and Patient moving all extremities  Post vital signs: Reviewed and stable  Complications: No apparent anesthesia complications

## 2013-01-22 NOTE — Anesthesia Postprocedure Evaluation (Signed)
  Anesthesia Post-op Note  Patient: Suzanne Walker  Procedure(s) Performed: Procedure(s): APPENDECTOMY LAPAROSCOPIC (N/A)  Patient Location: PACU  Anesthesia Type:General  Level of Consciousness: awake, alert , oriented and patient cooperative  Airway and Oxygen Therapy: Patient Spontanous Breathing  Post-op Pain: 3 /10, mild  Post-op Assessment: Post-op Vital signs reviewed, Patient's Cardiovascular Status Stable, Respiratory Function Stable, Patent Airway, No signs of Nausea or vomiting and Pain level controlled  Post-op Vital Signs: Reviewed and stable  Complications: No apparent anesthesia complications

## 2013-01-22 NOTE — Op Note (Signed)
Patient:  Suzanne Walker  DOB:  [birth year]  MRN:  [id number]   Preop Diagnosis:  Acute appendicitis  Postop Diagnosis:  Ruptured appendicitis  Procedure:  Laparoscopic appendectomy with intra-abdominal drain placement  Surgeon:  Dr. Tilford Pillar  Anes:  General endotracheal, 0.5% Sensorcaine plain for local  Indications:  Patient is a 62 year old female presented to [facility] with a history of right lower quadrant abdominal pain. Workup and evaluation was consistent for acute appendicitis. Risks benefits and alternatives were discussed with the patient including but not limited to risk of bleeding, infection, appendiceal stump leak, intraoperative cardiac component events. Patient's questions and concerns are addressed the patient as consented for the planned procedure.  Procedure note:  Patient is taken to the operator is placed in supine position the or table time the general anesthetic is administered. Once patient was asleep symmetrically intubated a nurse anesthetist. At this point a Foley catheter is placed in standard sterile fashion by the operating room staff. Her abdomen is then prepped with DuraPrep solution and draped in standard fashion. Time out was performed. A stab incision was created infraumbilically and a previous infraumbilical scar with a 11 blade scalpel. Additional dissection down to subcuticular tissues carried out using a Coker clamp which is utilized to grasp the anterior normal fascia and lift this anteriorly. A Veress needle is inserted saline drop test is utilized confirm intraperitoneal placement. At this point a 12 mm trochars inserted over laparoscope allowing visualization the trocar entering into the peritoneal cavity. At this point the inner cannulas removed lap strips reinserted there is no evidence of any trocar or Veress needle placement injury. I inspection of the inferior abdomen there is noted to be significant adhesions with multiple loops of  small intestine adherent to the anterior normal wall. Due to the inability to visualize the left lower quadrant opted to move the suprapubic trocar site lateral to the right slightly in place the 11 mm trocar in this position. This was placed under direct visualization and was well away from all anterior abdominal adhesions. The scope was placed into the 11 mm chromic at the umbilical trocar site which was noted to be again well away from any adhesions or adherent loops as intestine. At this point I placed a 5 mm trocar lateral to the midline on the right under direct visualization. Patient's placed into a Trendelenburg left lateral decubitus position. The colon was identified and was followed down to the base of the colon. The tinea terminate at the base the appendix and the appendix was elevated into the field it was grossly necrotic. There is obvious before discharge from the necrotic portion of the appendix. The base the appendix however did look viable and I felt that a staple division and ligation of the appendiceal stump with the feasible. A window was created between the base the appendix and mesoappendix an Endo GIA standard stapler load to 35 mm staple load was utilized to divide the base the appendix. The Enseal bipolar device is then utilized to ligate the mesoappendix. Once necrotic appendix was free it was placed into an Endo Catch bag. A small fecalith was also identified and this was retrieved and removed without issue. Inspection of the staple lines indicate excellent closure. There is no evidence of any bleeding from either the staple line and mesoappendix. I copiously irrigated the surgical field this point with warm sterile saline until the returning aspirate returned clear. At this point a 10 flat JP drain was placed  with the JP put out the 11 mm trocar site. With this in position by the right paracolic gutter and the appendiceal stump the 10 mm scope is exchanged for a 5 cigarette the appendix  could be retrieved the umbilical trocar site. The appendix was retrieved without difficulty. It was placed in the back table and inset as a perm specimen. To note the Endo Catch bag was complete without any defects are tears. At this point pneumoperitoneum was evacuated. A the drain was secured with a 3-0 nylon suture to the skin. The fascia at the umbilical trocar site was reapproximated with a 2-0 Vicryl. The skin edges at the remaining true trocar sites was reapproximated with skin staples. The skin was washed dried moist dry towel. Sterile dressings were placed the drapes removed the dressings were secured. The patient left come general anesthetic and stretcher back in stable condition. At the conclusion of procedure all instrument, sponge, needle counts are correct.  Complications:  None apparent  EBL:  Less than 100 miles  Specimen:  Appendix

## 2013-01-22 NOTE — ED Provider Notes (Signed)
CSN: [id number]     Arrival date & time 01/22/13  1033 History   First MD Initiated Contact with Patient 01/22/13 1119     Chief Complaint  Patient presents with  . Abdominal Pain   (Consider location/radiation/quality/duration/timing/severity/associated sxs/prior Treatment) Patient is a 61 y.o. female presenting with abdominal pain. The history is provided by the patient (the pt complains of abd pain since yesterday).  Abdominal Pain Pain location:  RLQ Pain quality: aching   Pain radiates to:  Does not radiate Pain severity:  Severe Onset quality:  Gradual Timing:  Constant Progression:  Worsening Chronicity:  New Context: not alcohol use   Associated symptoms: no chest pain, no cough, no diarrhea, no fatigue and no hematuria     Past Medical History  Diagnosis Date  . Gastroparesis   . Anxiety disorder   . COPD (chronic obstructive pulmonary disease)   . Depression   . Hyperlipidemia    Past Surgical History  Procedure Laterality Date  . Tonsillectomy    . Tubal ligation    . S/p hysterectomy    . Cyst removed      from finger  . Back surgery      x 2  . Colonoscopy  10/10/07    adenomatous polyps   No family history on file. History  Substance Use Topics  . Smoking status: Former Smoker -- 0.50 packs/day    Types: Cigarettes  . Smokeless tobacco: Not on file  . Alcohol Use: No   OB History   Grav Para Term Preterm Abortions TAB SAB Ect Mult Living                 Review of Systems  Constitutional: Negative for appetite change and fatigue.  HENT: Negative for congestion, ear discharge and sinus pressure.   Eyes: Negative for discharge.  Respiratory: Negative for cough.   Cardiovascular: Negative for chest pain.  Gastrointestinal: Positive for abdominal pain. Negative for diarrhea.  Genitourinary: Negative for frequency and hematuria.  Musculoskeletal: Negative for back pain.  Skin: Negative for rash.  Neurological: Negative for seizures and  headaches.  Psychiatric/Behavioral: Negative for hallucinations.    Allergies  Ibuprofen  Home Medications   Current Outpatient Rx  Name  Route  Sig  Dispense  Refill  . albuterol (PROVENTIL HFA) 108 (90 BASE) MCG/ACT inhaler   Inhalation   Inhale 2 puffs into the lungs every 6 (six) hours as needed.           . chlordiazePOXIDE (LIBRIUM) 10 MG capsule   Oral   Take 10 mg by mouth 2 (two) times daily.          . Docosanol (ABREVA) 10 % CREA   Apply externally   Apply topically.           . DULoxetine (CYMBALTA) 60 MG capsule   Oral   Take 60 mg by mouth daily.           . flunisolide (NASAREL) 29 MCG/ACT (0.025%) nasal spray   Nasal   2 sprays by Nasal route 2 (two) times daily. Dose is for each nostril.          . Glycerin-Hypromellose-PEG 400 (VISINE TEARS OP)   Ophthalmic   Apply to eye.           Marland Kitchen ipratropium-albuterol (DUONEB) 0.5-2.5 (3) MG/3ML SOLN   Nebulization   Take 3 mLs by nebulization.           . metoCLOPramide (REGLAN) 5 MG  tablet   Oral   Take 5 mg by mouth 4 (four) times daily.           . promethazine (PHENERGAN) 25 MG tablet   Oral   Take 1 tablet (25 mg total) by mouth every 6 (six) hours as needed.   30 tablet   11   . Wheat Dextrin (BENEFIBER DRINK MIX) PACK   Oral   Take by mouth.           . zolpidem (AMBIEN) 10 MG tablet   Oral   Take 10 mg by mouth at bedtime as needed.            BP 96/55  Pulse 104  Temp(Src) 97.8 F (36.6 C) (Oral)  Resp 20  SpO2 96% Physical Exam  Constitutional: She is oriented to person, place, and time. She appears well-developed.  HENT:  Head: Normocephalic.  Eyes: Conjunctivae and EOM are normal. No scleral icterus.  Neck: Neck supple. No thyromegaly present.  Cardiovascular: Normal rate and regular rhythm.  Exam reveals no gallop and no friction rub.   No murmur heard. Pulmonary/Chest: No stridor. She has no wheezes. She has no rales. She exhibits no tenderness.   Abdominal: She exhibits no distension. There is tenderness. There is no rebound.  Tender rlq  Musculoskeletal: Normal range of motion. She exhibits no edema.  Lymphadenopathy:    She has no cervical adenopathy.  Neurological: She is oriented to person, place, and time. She exhibits normal muscle tone. Coordination normal.  Skin: No rash noted. No erythema.  Psychiatric: She has a normal mood and affect. Her behavior is normal.    ED Course  Procedures (including critical care time) Labs Review Labs Reviewed  CBC WITH DIFFERENTIAL - Abnormal; Notable for the following:    WBC 18.0 (*)    Neutrophils Relative % 85 (*)    Neutro Abs 15.2 (*)    Lymphocytes Relative 6 (*)    Monocytes Absolute 1.6 (*)    All other components within normal limits  BASIC METABOLIC PANEL - Abnormal; Notable for the following:    Potassium 3.3 (*)    Glucose, Bld 162 (*)    GFR calc non Af Amer 89 (*)    All other components within normal limits  LIPASE, BLOOD  HEPATIC FUNCTION PANEL  URINALYSIS, ROUTINE W REFLEX MICROSCOPIC   Imaging Review Ct Abdomen Pelvis W Contrast  01/22/2013   CLINICAL DATA:  Abdominal pain with nausea. Prior hysterectomy.  EXAM: CT ABDOMEN AND PELVIS WITH CONTRAST  TECHNIQUE: Multidetector CT imaging of the abdomen and pelvis was performed using the standard protocol following bolus administration of intravenous contrast.  CONTRAST:  OMNIPAQUE IOHEXOL 300 MG/ML  SOLN  COMPARISON:  CT urogram 10/01/2005.  FINDINGS: There is stable mild scarring in both lung bases. No significant pleural or pericardial effusion is present. The distal esophagus demonstrates circumferential wall thickening.  There is a stable small calcified splenic granuloma. The spleen, liver, gallbladder, biliary system, pancreas, adrenal glands and kidneys demonstrate no significant findings.  The appendix is moderately distended to 12 mm and demonstrates wall thickening and surrounding inflammatory change.  The cecum and terminal ileum appear normal. There is no focal extraluminal fluid collection. There are postsurgical changes in the pelvis related to previous hysterectomy. No adnexal mass is demonstrated. The bladder appears normal. There are scattered vascular calcifications without evidence of aneurysm or large vessel occlusion.  IMPRESSION: 1. Acute appendicitis with periappendiceal inflammatory changes. No specific signs of  appendiceal rupture or abscess. 2. No evidence of bowel obstruction. 3. Prior hysterectomy. 4. Mild distal esophageal wall thickening, possibly related to chronic reflux. These results were called by telephone at the time of interpretation on 01/22/2013 at 12:54 PM to Dr. Bethann Berkshire , who verbally acknowledged these results.   Electronically Signed   By: Roxy Horseman M.D.   On: 01/22/2013 12:55   Dg Chest Port 1 View  01/22/2013   CLINICAL DATA:  Preop appendicitis  EXAM: PORTABLE CHEST - 1 VIEW  COMPARISON:  05/28/2005  FINDINGS: The lungs are clear without infiltrate or effusion. Negative for heart failure or mass lesion.  IMPRESSION: No active disease.   Electronically Signed   By: Marlan Palau M.D.   On: 01/22/2013 13:23    EKG Interpretation   None       MDM   1. Appendicitis        Benny Lennert, MD 01/22/13 1344

## 2013-01-22 NOTE — ED Notes (Signed)
Upper abd pain radiating to lower abd starting yesterday.  Reports pain radiates from abd to back.  Also c/o n/v/d.  Last normal BM x 2 days ago.

## 2013-01-22 NOTE — Anesthesia Preprocedure Evaluation (Addendum)
Anesthesia Evaluation  Patient identified by MRN, date of birth, ID band Patient awake    Reviewed: Allergy & Precautions, H&P , NPO status , Patient's Chart, lab work & pertinent test results, reviewed documented beta blocker date and time   Airway Mallampati: II TM Distance: >3 FB Neck ROM: Full    Dental  (+) Dental Advisory Given,    Pulmonary COPDformer smoker,  breath sounds clear to auscultation        Cardiovascular Exercise Tolerance: Good Rhythm:Regular Rate:Tachycardia     Neuro/Psych PSYCHIATRIC DISORDERS Anxiety Depression    GI/Hepatic hiatal hernia, GERD-  Controlled,  Endo/Other    Renal/GU      Musculoskeletal   Abdominal (+) + scaphoid  Abdomen: soft. Bowel sounds: normal.  Peds  Hematology   Anesthesia Other Findings   Reproductive/Obstetrics                          Anesthesia Physical Anesthesia Plan  ASA: II and emergent  Anesthesia Plan: General   Post-op Pain Management:    Induction: Intravenous, Cricoid pressure planned and Rapid sequence  Airway Management Planned: Oral ETT  Additional Equipment:   Intra-op Plan:   Post-operative Plan: Extubation in OR  Informed Consent:   Dental advisory given  Plan Discussed with: CRNA and Anesthesiologist  Anesthesia Plan Comments:         Anesthesia Quick Evaluation

## 2013-01-22 NOTE — Anesthesia Procedure Notes (Signed)
Procedure Name: Intubation Date/Time: 01/22/2013 5:52 PM Performed by: Despina Hidden Pre-anesthesia Checklist: Emergency Drugs available, Suction available, Patient identified and Patient being monitored Patient Re-evaluated:Patient Re-evaluated prior to inductionOxygen Delivery Method: Circle system utilized Preoxygenation: Pre-oxygenation with 100% oxygen Intubation Type: IV induction, Rapid sequence and Cricoid Pressure applied Ventilation: Mask ventilation without difficulty and Oral airway inserted - appropriate to patient size Laryngoscope Size: Mac and 3 Grade View: Grade II Tube type: Oral Tube size: 7.0 mm Number of attempts: 1 Airway Equipment and Method: Stylet Placement Confirmation: ETT inserted through vocal cords under direct vision,  positive ETCO2 and breath sounds checked- equal and bilateral Secured at: 22 cm Tube secured with: Tape Dental Injury: Teeth and Oropharynx as per pre-operative assessment

## 2013-01-23 LAB — BASIC METABOLIC PANEL
Chloride: 97 mEq/L (ref 96–112)
GFR calc Af Amer: 90 mL/min — ABNORMAL LOW (ref >90)
GFR calc non Af Amer: 77 mL/min — ABNORMAL LOW (ref >90)
Potassium: 3.7 mEq/L (ref 3.5–5.1)
Sodium: 136 mEq/L (ref 135–145)

## 2013-01-23 LAB — CBC
Hemoglobin: 11.3 g/dL — ABNORMAL LOW (ref 12.0–15.0)
MCV: 87.1 fL (ref 78.0–100.0)
RBC: 3.81 MIL/uL — ABNORMAL LOW (ref 3.87–5.11)
WBC: 19 10*3/uL — ABNORMAL HIGH (ref 4.0–10.5)

## 2013-01-23 MED ORDER — DULOXETINE HCL 60 MG PO CPEP
60.0000 mg | ORAL_CAPSULE | Freq: Every day | ORAL | Status: DC
  Administered 2013-01-23 – 2013-01-27 (×5): 60 mg via ORAL
  Filled 2013-01-23 (×5): qty 1

## 2013-01-23 MED ORDER — HYDROMORPHONE HCL PF 1 MG/ML IJ SOLN
1.0000 mg | INTRAMUSCULAR | Status: DC | PRN
  Administered 2013-01-23 – 2013-01-25 (×12): 1 mg via INTRAVENOUS
  Filled 2013-01-23 (×12): qty 1

## 2013-01-23 MED ORDER — DULOXETINE HCL 60 MG PO CPEP
60.0000 mg | ORAL_CAPSULE | Freq: Every day | ORAL | Status: DC
  Filled 2013-01-23: qty 1

## 2013-01-23 NOTE — Anesthesia Postprocedure Evaluation (Signed)
  Anesthesia Post-op Note  Patient: Suzanne Walker  Procedure(s) Performed: Procedure(s): APPENDECTOMY LAPAROSCOPIC (N/A)  Patient Location:Room 329  Anesthesia Type:General  Level of Consciousness: awake, alert , oriented and patient cooperative  Airway and Oxygen Therapy: Patient Spontanous Breathing  Post-op Pain: mild  Post-op Assessment: Post-op Vital signs reviewed, Patient's Cardiovascular Status Stable, Respiratory Function Stable, Patent Airway, NAUSEA AND VOMITING PRESENT and Pain level controlled  Post-op Vital Signs: Reviewed and stable  Complications: No apparent anesthesia complications

## 2013-01-23 NOTE — Progress Notes (Signed)
Utilization Review Complete  

## 2013-01-23 NOTE — Progress Notes (Signed)
Dr. Leticia Penna paged and made aware of patient's concern with restarting home medication of Cymbalta 60 mg daily. New order received to restart.  Also new order received to start patient on full liquid diet.

## 2013-01-24 ENCOUNTER — Encounter (HOSPITAL_COMMUNITY): Payer: Self-pay | Admitting: General Surgery

## 2013-01-24 MED ORDER — ONDANSETRON HCL 4 MG/2ML IJ SOLN
4.0000 mg | Freq: Four times a day (QID) | INTRAMUSCULAR | Status: DC | PRN
  Administered 2013-01-25 – 2013-01-27 (×9): 4 mg via INTRAVENOUS
  Filled 2013-01-24 (×9): qty 2

## 2013-01-24 NOTE — Progress Notes (Signed)
2 Days Post-Op  Subjective: Pain about the same. No increased fevers or chills. No nausea.  Objective: Vital signs in last 24 hours: Temp:  [97.8 F (36.6 Walker)-98.6 F (37 Walker)] 98.3 F (36.8 Walker) (10/17 0538) Pulse Rate:  [100-109] 100 (10/17 0538) Resp:  [18-20] 20 (10/17 0538) BP: (102-116)/(58-74) 102/58 mmHg (10/17 0538) SpO2:  [95 %-96 %] 95 % (10/17 0538) Last BM Date: 01/22/13  Intake/Output from previous day: 10/16 0701 - 10/17 0700 In: 3252.5 [P.O.:720; I.V.:2332.5; IV Piggyback:100] Out: 1590 [Urine:1550; Drains:40] Intake/Output this shift: Total I/O In: 120 [P.O.:120] Out: -   General appearance: alert and no distress GI: Intermittent bowel sounds, soft, mild distention, JP serous sanguinous with slightly cloudy. Dressings are intact. No diffuse peritoneal signs.  Lab Results:   Recent Labs  01/22/13 1045 01/23/13 0550  WBC 18.0* 19.0*  HGB 14.3 11.3*  HCT 41.0 33.2*  PLT 253 199   BMET  Recent Labs  01/22/13 1045 01/23/13 0550  NA 137 136  K 3.3* 3.7  CL 96 97  CO2 28 30  GLUCOSE 162* 156*  BUN 7 9  CREATININE 0.74 0.80  CALCIUM 9.5 8.6   PT/INR No results found for this basename: LABPROT, INR,  in the last 72 hours ABG No results found for this basename: PHART, PCO2, PO2, HCO3,  in the last 72 hours  Studies/Results: Ct Abdomen Pelvis W Contrast  01/22/2013   CLINICAL DATA:  Abdominal pain with nausea. Prior hysterectomy.  EXAM: CT ABDOMEN AND PELVIS WITH CONTRAST  TECHNIQUE: Multidetector CT imaging of the abdomen and pelvis was performed using the standard protocol following bolus administration of intravenous contrast.  CONTRAST:  OMNIPAQUE IOHEXOL 300 MG/ML  SOLN  COMPARISON:  CT urogram 10/01/2005.  FINDINGS: There is stable mild scarring in both lung bases. No significant pleural or pericardial effusion is present. The distal esophagus demonstrates circumferential wall thickening.  There is a stable small calcified splenic granuloma.  The spleen, liver, gallbladder, biliary system, pancreas, adrenal glands and kidneys demonstrate no significant findings.  The appendix is moderately distended to 12 mm and demonstrates wall thickening and surrounding inflammatory change. The cecum and terminal ileum appear normal. There is no focal extraluminal fluid collection. There are postsurgical changes in the pelvis related to previous hysterectomy. No adnexal mass is demonstrated. The bladder appears normal. There are scattered vascular calcifications without evidence of aneurysm or large vessel occlusion.  IMPRESSION: 1. Acute appendicitis with periappendiceal inflammatory changes. No specific signs of appendiceal rupture or abscess. 2. No evidence of bowel obstruction. 3. Prior hysterectomy. 4. Mild distal esophageal wall thickening, possibly related to chronic reflux. These results were called by telephone at the time of interpretation on 01/22/2013 at 12:54 PM to Dr. Bethann Berkshire , who verbally acknowledged these results.   Electronically Signed   By: Roxy Horseman M.D.   On: 01/22/2013 12:55   Dg Chest Port 1 View  01/22/2013   CLINICAL DATA:  Preop appendicitis  EXAM: PORTABLE CHEST - 1 VIEW  COMPARISON:  05/28/2005  FINDINGS: The lungs are clear without infiltrate or effusion. Negative for heart failure or mass lesion.  IMPRESSION: No active disease.   Electronically Signed   By: Marlan Palau M.D.   On: 01/22/2013 13:23    Anti-infectives: Anti-infectives   Start     Dose/Rate Route Frequency Ordered Stop   01/22/13 2200  piperacillin-tazobactam (ZOSYN) IVPB 3.375 g     3.375 g 12.5 mL/hr over 240 Minutes Intravenous 3 times  per day 01/22/13 2015     01/22/13 1315  piperacillin-tazobactam (ZOSYN) IVPB 3.375 g     3.375 g 12.5 mL/hr over 240 Minutes Intravenous  Once 01/22/13 1301 01/22/13 1344      Assessment/Plan: s/p Procedure(s): APPENDECTOMY LAPAROSCOPIC (N/A) Continue IV antibiotics. Stable clears for now as tolerated.  Increase activity.  LOS: 2 days    Suzanne Walker 01/24/2013

## 2013-01-24 NOTE — Progress Notes (Signed)
2 Days Post-Op  Subjective: Pain better. No nausea. No fevers or chills.  Objective: Vital signs in last 24 hours: Temp:  [97.8 F (36.6 Walker)-98.6 F (37 Walker)] 98.3 F (36.8 Walker) (10/17 0538) Pulse Rate:  [100-109] 100 (10/17 0538) Resp:  [18-20] 20 (10/17 0538) BP: (102-116)/(58-74) 102/58 mmHg (10/17 0538) SpO2:  [95 %-96 %] 95 % (10/17 0538) Last BM Date: 01/22/13  Intake/Output from previous day: 10/16 0701 - 10/17 0700 In: 3252.5 [P.O.:720; I.V.:2332.5; IV Piggyback:100] Out: 1590 [Urine:1550; Drains:40] Intake/Output this shift: Total I/O In: 120 [P.O.:120] Out: -   General appearance: alert and no distress GI: Quiet, soft, expected postoperative tenderness. JP serous and is. Dressings clean dry.  Lab Results:   Recent Labs  01/22/13 1045 01/23/13 0550  WBC 18.0* 19.0*  HGB 14.3 11.3*  HCT 41.0 33.2*  PLT 253 199   BMET  Recent Labs  01/22/13 1045 01/23/13 0550  NA 137 136  K 3.3* 3.7  CL 96 97  CO2 28 30  GLUCOSE 162* 156*  BUN 7 9  CREATININE 0.74 0.80  CALCIUM 9.5 8.6   PT/INR No results found for this basename: LABPROT, INR,  in the last 72 hours ABG No results found for this basename: PHART, PCO2, PO2, HCO3,  in the last 72 hours  Studies/Results: Ct Abdomen Pelvis W Contrast  01/22/2013   CLINICAL DATA:  Abdominal pain with nausea. Prior hysterectomy.  EXAM: CT ABDOMEN AND PELVIS WITH CONTRAST  TECHNIQUE: Multidetector CT imaging of the abdomen and pelvis was performed using the standard protocol following bolus administration of intravenous contrast.  CONTRAST:  OMNIPAQUE IOHEXOL 300 MG/ML  SOLN  COMPARISON:  CT urogram 10/01/2005.  FINDINGS: There is stable mild scarring in both lung bases. No significant pleural or pericardial effusion is present. The distal esophagus demonstrates circumferential wall thickening.  There is a stable small calcified splenic granuloma. The spleen, liver, gallbladder, biliary system, pancreas, adrenal glands  and kidneys demonstrate no significant findings.  The appendix is moderately distended to 12 mm and demonstrates wall thickening and surrounding inflammatory change. The cecum and terminal ileum appear normal. There is no focal extraluminal fluid collection. There are postsurgical changes in the pelvis related to previous hysterectomy. No adnexal mass is demonstrated. The bladder appears normal. There are scattered vascular calcifications without evidence of aneurysm or large vessel occlusion.  IMPRESSION: 1. Acute appendicitis with periappendiceal inflammatory changes. No specific signs of appendiceal rupture or abscess. 2. No evidence of bowel obstruction. 3. Prior hysterectomy. 4. Mild distal esophageal wall thickening, possibly related to chronic reflux. These results were called by telephone at the time of interpretation on 01/22/2013 at 12:54 PM to Dr. Bethann Berkshire , who verbally acknowledged these results.   Electronically Signed   By: Roxy Horseman M.D.   On: 01/22/2013 12:55   Dg Chest Port 1 View  01/22/2013   CLINICAL DATA:  Preop appendicitis  EXAM: PORTABLE CHEST - 1 VIEW  COMPARISON:  05/28/2005  FINDINGS: The lungs are clear without infiltrate or effusion. Negative for heart failure or mass lesion.  IMPRESSION: No active disease.   Electronically Signed   By: Marlan Palau M.D.   On: 01/22/2013 13:23    Anti-infectives: Anti-infectives   Start     Dose/Rate Route Frequency Ordered Stop   01/22/13 2200  piperacillin-tazobactam (ZOSYN) IVPB 3.375 g     3.375 g 12.5 mL/hr over 240 Minutes Intravenous 3 times per day 01/22/13 2015     01/22/13 1315  piperacillin-tazobactam (ZOSYN) IVPB 3.375 g     3.375 g 12.5 mL/hr over 240 Minutes Intravenous  Once 01/22/13 1301 01/22/13 1344      Assessment/Plan: s/p Procedure(s): APPENDECTOMY LAPAROSCOPIC (N/A) Status post procedure appendicitis. Continue IV antibiotics. May start clear liquids. Increase activity.  LOS: 2 days     Suzanne Walker 01/24/2013

## 2013-01-24 NOTE — Anesthesia Postprocedure Evaluation (Signed)
  Anesthesia Post-op Note  Patient: Suzanne Walker  Procedure(s) Performed: Procedure(s): APPENDECTOMY LAPAROSCOPIC (N/A)  Patient Location: Room 329  Anesthesia Type:General  Level of Consciousness: awake, alert , oriented and patient cooperative  Airway and Oxygen Therapy: Patient Spontanous Breathing and Patient connected to nasal cannula oxygen  Post-op Pain: mild  Post-op Assessment: Post-op Vital signs reviewed, Patient's Cardiovascular Status Stable, Respiratory Function Stable, Patent Airway, No signs of Nausea or vomiting, Adequate PO intake and Pain level controlled  Post-op Vital Signs: Reviewed and stable  Complications: No apparent anesthesia complications

## 2013-01-25 MED ORDER — POLYETHYLENE GLYCOL 3350 17 G PO PACK
17.0000 g | PACK | Freq: Two times a day (BID) | ORAL | Status: DC
  Administered 2013-01-25 – 2013-01-27 (×4): 17 g via ORAL
  Filled 2013-01-25 (×7): qty 1

## 2013-01-25 MED ORDER — PANTOPRAZOLE SODIUM 40 MG PO TBEC
40.0000 mg | DELAYED_RELEASE_TABLET | Freq: Every day | ORAL | Status: DC
  Administered 2013-01-25 – 2013-01-27 (×3): 40 mg via ORAL
  Filled 2013-01-25 (×3): qty 1

## 2013-01-25 MED ORDER — OXYCODONE-ACETAMINOPHEN 5-325 MG PO TABS
1.0000 | ORAL_TABLET | ORAL | Status: DC | PRN
  Administered 2013-01-25: 1 via ORAL
  Administered 2013-01-25: 2 via ORAL
  Administered 2013-01-25: 1 via ORAL
  Administered 2013-01-26 (×2): 2 via ORAL
  Administered 2013-01-26: 1 via ORAL
  Administered 2013-01-26: 2 via ORAL
  Administered 2013-01-27: 1 via ORAL
  Administered 2013-01-28: 2 via ORAL
  Filled 2013-01-25: qty 2
  Filled 2013-01-25: qty 1
  Filled 2013-01-25 (×2): qty 2
  Filled 2013-01-25 (×3): qty 1
  Filled 2013-01-25 (×3): qty 2

## 2013-01-25 MED ORDER — OXYCODONE HCL 5 MG PO TABS
5.0000 mg | ORAL_TABLET | ORAL | Status: DC | PRN
  Administered 2013-01-25 – 2013-01-27 (×6): 5 mg via ORAL
  Filled 2013-01-25 (×6): qty 1

## 2013-01-25 MED ORDER — HYDROMORPHONE HCL PF 1 MG/ML IJ SOLN
1.0000 mg | INTRAMUSCULAR | Status: DC | PRN
  Administered 2013-01-27: 1 mg via INTRAVENOUS
  Filled 2013-01-25: qty 1

## 2013-01-25 NOTE — Progress Notes (Signed)
Patient requested nausea medicine and refused to take it after medicine was ordered. Patient has not complained of nausea since medicine was ordered.

## 2013-01-25 NOTE — Progress Notes (Signed)
3 Days Post-Op  Subjective: Still having incisional pain. Tolerating liquid diet. No significant bowel movement yet.  Objective: Vital signs in last 24 hours: Temp:  [97.7 F (36.5 C)-98.4 F (36.9 C)] 97.7 F (36.5 C) (10/18 0500) Pulse Rate:  [94-105] 105 (10/18 0500) Resp:  [18-20] 18 (10/18 0500) BP: (99-119)/(49-70) 119/49 mmHg (10/18 0500) SpO2:  [76 %-96 %] 93 % (10/18 0822) Last BM Date: 01/22/13  Intake/Output from previous day: 10/17 0701 - 10/18 0700 In: 120 [P.O.:120] Out: 290 [Urine:200; Drains:90] Intake/Output this shift:    General appearance: alert, cooperative and no distress Resp: clear to auscultation bilaterally Cardio: regular rate and rhythm, S1, S2 normal, no murmur, click, rub or gallop GI: Soft, mildly distended. JP drainage serosanguineous with mild cloudiness. No rigidity.  Lab Results:   Recent Labs  01/22/13 1045 01/23/13 0550  WBC 18.0* 19.0*  HGB 14.3 11.3*  HCT 41.0 33.2*  PLT 253 199   BMET  Recent Labs  01/22/13 1045 01/23/13 0550  NA 137 136  K 3.3* 3.7  CL 96 97  CO2 28 30  GLUCOSE 162* 156*  BUN 7 9  CREATININE 0.74 0.80  CALCIUM 9.5 8.6   PT/INR No results found for this basename: LABPROT, INR,  in the last 72 hours  Studies/Results: No results found.  Anti-infectives: Anti-infectives   Start     Dose/Rate Route Frequency Ordered Stop   01/22/13 2200  piperacillin-tazobactam (ZOSYN) IVPB 3.375 g     3.375 g 12.5 mL/hr over 240 Minutes Intravenous 3 times per day 01/22/13 2015     01/22/13 1315  piperacillin-tazobactam (ZOSYN) IVPB 3.375 g     3.375 g 12.5 mL/hr over 240 Minutes Intravenous  Once 01/22/13 1301 01/22/13 1344      Assessment/Plan: s/p Procedure(s): APPENDECTOMY LAPAROSCOPIC Impression: Status post laparoscopic appendectomy for perforated appendicitis. Bowel function has not fully returned yet. Still with leukocytosis. Continue antibiotic coverage. We'll start MiraLAX.  LOS: 3 days     Suzanne Walker A 01/25/2013

## 2013-01-25 NOTE — Progress Notes (Signed)
The patient is receiving Protonix by the intravenous route.  Based on criteria approved by the Pharmacy and Therapeutics Committee and the Medical Executive Committee, the medication is being converted to the equivalent oral dose form.  These criteria include: -No Active GI bleeding -Able to tolerate diet of full liquids (or better) or tube feeding OR able to tolerate other medications by the oral or enteral route  If you have any questions about this conversion, please contact the Pharmacy Department (ext [phone]).  Thank you.  Suzanne Walker, [facility] 01/25/2013 8:41 AM

## 2013-01-25 NOTE — Progress Notes (Signed)
Patient ambulated in halls today with husband at her side. No issues or complaints noted when she returned to her room. Did educate patient on JP drain maintenance.

## 2013-01-26 LAB — BASIC METABOLIC PANEL
BUN: 5 mg/dL — ABNORMAL LOW (ref 6–23)
Creatinine, Ser: 0.64 mg/dL (ref 0.50–1.10)
GFR calc Af Amer: >90 mL/min (ref >90)
GFR calc non Af Amer: >90 mL/min (ref >90)
Potassium: 2.7 mEq/L — CL (ref 3.5–5.1)
Sodium: 140 mEq/L (ref 135–145)

## 2013-01-26 LAB — CBC
HCT: 35 % — ABNORMAL LOW (ref 36.0–46.0)
MCHC: 33.7 g/dL (ref 30.0–36.0)
RDW: 12.9 % (ref 11.5–15.5)

## 2013-01-26 MED ORDER — POTASSIUM CHLORIDE 10 MEQ/100ML IV SOLN
10.0000 meq | INTRAVENOUS | Status: AC
  Administered 2013-01-26 (×6): 10 meq via INTRAVENOUS
  Filled 2013-01-26 (×6): qty 100

## 2013-01-26 NOTE — Progress Notes (Signed)
D- While up using the bathroom Pt had large amount of drainage from around JP drain saturate her dressing to overflowing and dripped a significant amount onto floor at 2230.    A- I assisted pt back to bed and removed saturated dressing and applied a new dressing.  JP drain was monitored frequently for the rest of the night. JP drain was emptied and measured.  Dressing remained dry and intact.  See doc flowsheets for details.  Dr. Lovell Sheehan notified this am of the above and no new orders were given at this time.  R- JP drain last assessed and emptied at 0600 and the dressing was clean dry and intact.  Nursing staff to continue to monitor.

## 2013-01-26 NOTE — Progress Notes (Signed)
4 Days Post-Op  Subjective: Incisional pain under better control. Has had several bowel movements. Was a little short of breath yesterday and started on nasal cannula. This has since improved.  Objective: Vital signs in last 24 hours: Temp:  [97.8 F (36.6 C)-97.9 F (36.6 C)] 97.8 F (36.6 C) (10/19 0656) Pulse Rate:  [92-97] 96 (10/19 0656) Resp:  [17-19] 19 (10/19 0656) BP: (106-135)/(65-83) 106/71 mmHg (10/19 0656) SpO2:  [96 %-99 %] 96 % (10/19 0656) Last BM Date: 01/25/13  Intake/Output from previous day: 10/18 0701 - 10/19 0700 In: 5303 [P.O.:720; I.V.:4383; IV Piggyback:200] Out: 602 [Drains:600; Stool:2] Intake/Output this shift: Total I/O In: 30 [Other:30] Out: -   General appearance: alert, cooperative, appears older than stated age and no distress Resp: clear to auscultation bilaterally Cardio: regular rate and rhythm, S1, S2 normal, no murmur, click, rub or gallop GI: Soft. JP drainage serous in nature. Dressings dry and intact. Active bowel sounds appreciated.  Lab Results:   Recent Labs  01/26/13 0648  WBC 10.2  HGB 11.8*  HCT 35.0*  PLT 236   BMET  Recent Labs  01/26/13 0648  NA 140  K 2.7*  CL 97  CO2 38*  GLUCOSE 101*  BUN 5*  CREATININE 0.64  CALCIUM 8.3*   PT/INR No results found for this basename: LABPROT, INR,  in the last 72 hours  Studies/Results: No results found.  Anti-infectives: Anti-infectives   Start     Dose/Rate Route Frequency Ordered Stop   01/22/13 2200  piperacillin-tazobactam (ZOSYN) IVPB 3.375 g     3.375 g 12.5 mL/hr over 240 Minutes Intravenous 3 times per day 01/22/13 2015     01/22/13 1315  piperacillin-tazobactam (ZOSYN) IVPB 3.375 g     3.375 g 12.5 mL/hr over 240 Minutes Intravenous  Once 01/22/13 1301 01/22/13 1344      Assessment/Plan: s/p Procedure(s): APPENDECTOMY LAPAROSCOPIC Impression: Continues to improve, status post laparoscopic appendectomy for perforated appendicitis. Leukocytosis has  resolved. Patient does have a history of COPD. Hypokalemia will be addressed. We'll advance to soft diet. Anticipate discharge in next 24-48 hours.  LOS: 4 days    Kimber Esterly A 01/26/2013

## 2013-01-26 NOTE — Progress Notes (Signed)
CRITICAL VALUE ALERT  Critical value received: K 2.7  Date of notification:  01/26/2013   Time of notification:  07:41  Critical value read back:yes  Nurse who received alert:  Cyndia Diver, RN  MD notified (1st page): Dr. Leticia Penna  Time of first page:  07:45  MD notified (2nd page):  Time of second page:  Responding MD:  Lovell Sheehan  Time MD responded:  07:49

## 2013-01-26 NOTE — Progress Notes (Addendum)
Rt checked on PT, PT on room air, SAT only 83 -88 on room air , Placed back on 3lpm/[place] Started her on Incentive Spirometry she only did 500. Pt states she is coughing up blood. I questioned if it was bright red or dark she stated, " It was blood". I questioned if It was large amount or small, she stated," It was as much as I could do". I asked again and only silence was given. I told her I was going to start her back on oxygen 3lpm/[place] she stated, " we took it off". .I am confused as to why incentive was not started. As order was given on 01/22/2013. I am also puzzled as Pt appears to be nonverbal when questioned. Pt appears to 7000 Plus on I and O. This may account for low SAT.

## 2013-01-27 LAB — BASIC METABOLIC PANEL WITH GFR
BUN: 6 mg/dL (ref 6–23)
CO2: 36 meq/L — ABNORMAL HIGH (ref 19–32)
Calcium: 8.6 mg/dL (ref 8.4–10.5)
Chloride: 94 meq/L — ABNORMAL LOW (ref 96–112)
Creatinine, Ser: 0.7 mg/dL (ref 0.50–1.10)
GFR calc Af Amer: >90 mL/min
GFR calc non Af Amer: >90 mL/min
Glucose, Bld: 118 mg/dL — ABNORMAL HIGH (ref 70–99)
Potassium: 3.1 meq/L — ABNORMAL LOW (ref 3.5–5.1)
Sodium: 135 meq/L (ref 135–145)

## 2013-01-27 LAB — CBC
HCT: 36.2 % (ref 36.0–46.0)
Hemoglobin: 12.1 g/dL (ref 12.0–15.0)
MCH: 29.4 pg (ref 26.0–34.0)
MCHC: 33.4 g/dL (ref 30.0–36.0)
MCV: 87.9 fL (ref 78.0–100.0)
Platelets: 255 10*3/uL (ref 150–400)
RBC: 4.12 MIL/uL (ref 3.87–5.11)
RDW: 13 % (ref 11.5–15.5)
WBC: 11.9 10*3/uL — ABNORMAL HIGH (ref 4.0–10.5)

## 2013-01-27 MED ORDER — POTASSIUM CHLORIDE 10 MEQ/100ML IV SOLN
10.0000 meq | INTRAVENOUS | Status: AC
  Administered 2013-01-27 (×4): 10 meq via INTRAVENOUS
  Filled 2013-01-27: qty 100

## 2013-01-27 MED ORDER — ALBUTEROL SULFATE (5 MG/ML) 0.5% IN NEBU
INHALATION_SOLUTION | RESPIRATORY_TRACT | Status: AC
  Filled 2013-01-27: qty 0.5

## 2013-01-27 MED ORDER — ALBUTEROL SULFATE (5 MG/ML) 0.5% IN NEBU
2.5000 mg | INHALATION_SOLUTION | Freq: Four times a day (QID) | RESPIRATORY_TRACT | Status: DC
  Administered 2013-01-27 – 2013-01-28 (×4): 2.5 mg via RESPIRATORY_TRACT
  Filled 2013-01-27 (×3): qty 0.5

## 2013-01-27 NOTE — Progress Notes (Signed)
Discussed importance of ambulation after surgery.  Pt states she does not feel like it right now, will continue to encourage patient to get OOB and ambulate in halls today.

## 2013-01-27 NOTE — Progress Notes (Signed)
Pt reeducated on importance of Incentive Spirometer and how to effectively use it. She demonstrated proper use, however only achieved 500.

## 2013-01-27 NOTE — Progress Notes (Signed)
5 Days Post-Op  Subjective: Having some nausea but no emesis.. Occasional shortness of breath with exertion. States that she has , but she stopped her inhalers many years ago.  Patient has a history of COPD.  Objective: Vital signs in last 24 hours: Temp:  [97.8 F (36.6 C)-98.3 F (36.8 C)] 98.2 F (36.8 C) (10/20 0500) Pulse Rate:  [90-93] 93 (10/20 0500) Resp:  [14-20] 14 (10/20 0500) BP: (121-149)/(69-87) 125/69 mmHg (10/20 0500) SpO2:  [88 %-96 %] 90 % (10/20 0743) Last BM Date: 01/27/13  Intake/Output from previous day: 10/19 0701 - 10/20 0700 In: 1603.9 [P.O.:680; I.V.:698.9] Out: 430 [Urine:300; Drains:130] Intake/Output this shift: Total I/O In: -  Out: 50 [Drains:50]  General appearance: alert, cooperative and no distress Resp: clear to auscultation bilaterally Cardio: regular rate and rhythm, S1, S2 normal, no murmur, click, rub or gallop GI: Soft. JP drainage serous in nature. Incisions healing well.  Lab Results:   Recent Labs  01/26/13 0648 01/27/13 0522  WBC 10.2 11.9*  HGB 11.8* 12.1  HCT 35.0* 36.2  PLT 236 255   BMET  Recent Labs  01/26/13 0648 01/27/13 0522  NA 140 135  K 2.7* 3.1*  CL 97 94*  CO2 38* 36*  GLUCOSE 101* 118*  BUN 5* 6  CREATININE 0.64 0.70  CALCIUM 8.3* 8.6   PT/INR No results found for this basename: LABPROT, INR,  in the last 72 hours  Studies/Results: No results found.  Anti-infectives: Anti-infectives   Start     Dose/Rate Route Frequency Ordered Stop   01/22/13 2200  piperacillin-tazobactam (ZOSYN) IVPB 3.375 g     3.375 g 12.5 mL/hr over 240 Minutes Intravenous 3 times per day 01/22/13 2015     01/22/13 1315  piperacillin-tazobactam (ZOSYN) IVPB 3.375 g     3.375 g 12.5 mL/hr over 240 Minutes Intravenous  Once 01/22/13 1301 01/22/13 1344      Assessment/Plan: s/p Procedure(s): APPENDECTOMY LAPAROSCOPIC Impression: Progressing well. Do not have an exact etiology for her nausea as she is having  multiple bowel movements. We'll start her back on her neb treatments. Have told her she needs to be on her neb treatments at home. Hopefully will discharge in next 24 hours of her nausea resolves and her breathing improves. I suspect that she runs low oxygen saturations on room air at home.  LOS: 5 days    Arjay Jaskiewicz A 01/27/2013

## 2013-01-27 NOTE — Care Management Note (Signed)
    Page 1 of 1   01/28/2013     4:09:52 PM   CARE MANAGEMENT NOTE 01/28/2013  Patient:  Suzanne Walker, Suzanne Walker   Account Number:  [id number]  Date Initiated:  01/27/2013  Documentation initiated by:  Rosemary Holms  Subjective/Objective Assessment:   Pt admitted from home. PO day 5. No antiipated HH needs.     Action/Plan:   Anticipated DC Date:  01/28/2013   Anticipated DC Plan:  HOME/SELF CARE      DC Planning Services  CM consult      Choice offered to / List presented to:             Status of service:  Completed, signed off Medicare Important Message given?   (If response is "NO", the following Medicare IM given date fields will be blank) Date Medicare IM given:   Date Additional Medicare IM given:    Discharge Disposition:    Per UR Regulation:    If discussed at Long Length of Stay Meetings, dates discussed:   01/28/2013    Comments:  01/27/13 Rosemary Holms RN BSN CM

## 2013-01-28 LAB — CBC
HCT: 36 % (ref 36.0–46.0)
Hemoglobin: 12 g/dL (ref 12.0–15.0)
MCH: 29.3 pg (ref 26.0–34.0)
RBC: 4.1 MIL/uL (ref 3.87–5.11)
WBC: 9.2 10*3/uL (ref 4.0–10.5)

## 2013-01-28 LAB — BASIC METABOLIC PANEL
CO2: 39 mEq/L — ABNORMAL HIGH (ref 19–32)
Chloride: 93 mEq/L — ABNORMAL LOW (ref 96–112)
Creatinine, Ser: 0.76 mg/dL (ref 0.50–1.10)
Glucose, Bld: 120 mg/dL — ABNORMAL HIGH (ref 70–99)
Potassium: 3.6 mEq/L (ref 3.5–5.1)
Sodium: 137 mEq/L (ref 135–145)

## 2013-01-28 MED ORDER — HYDROCODONE-ACETAMINOPHEN 7.5-325 MG PO TABS: 1.0000 | ORAL_TABLET | ORAL | Status: DC | PRN

## 2013-01-28 MED ORDER — PROMETHAZINE HCL 50 MG PO TABS: 25.0000 mg | ORAL_TABLET | Freq: Four times a day (QID) | ORAL | Status: DC | PRN

## 2013-01-28 MED ORDER — ALBUTEROL SULFATE HFA 108 (90 BASE) MCG/ACT IN AERS: 2.0000 | INHALATION_SPRAY | Freq: Four times a day (QID) | RESPIRATORY_TRACT | Status: DC | PRN

## 2013-01-28 NOTE — Progress Notes (Signed)
Pt discharged home today per Dr. Lovell Sheehan. Pt's IV site D/C'd and WNL. Pt's VS stable at this time. Pt provided with home medication list, discharge instructions and prescriptions. Verbalized understanding. Teach back method used. Pt demonstrated proper use of incentive spirometry. Husband at bedside. Pt left floor via WC in stable condition accompanied by NT.

## 2013-01-28 NOTE — Discharge Summary (Signed)
Physician Discharge Summary  Patient ID: RYLI STANDLEE MRN: [id number] DOB/AGE: 1951/02/19 62 y.o.  Admit date: 01/22/2013 Discharge date: 01/28/2013  Admission Diagnoses: Acute appendicitis with perforation, COPD  Discharge Diagnoses: Same Active Problems:   * No active hospital problems. *   Discharged Condition: good  Hospital Course: Patient is a 62 year old white female with a history of uncontrolled COPD who presented emergency room with worsening lower abdominal pain. CT scan the abdomen revealed acute appendicitis. The patient was taken to the operating room on 01/22/2013 and was found to have acute appendicitis with perforation. A JP drain was left. Her postoperative course was remarkable for a mild ileus, acute exacerbation of her COPD, and hypokalemia. Her postoperative course was protracted due to these. Her bowel function has returned and her diet was advanced at difficulty. Her hypokalemia was treated. The patient states that she had been on breathing treatments for COPD in the past, but had stopped them on her own. Those were reinstituted. She is being discharged home on 01/28/2013 in good and improving condition.  Treatments: surgery: Laparoscopic appendectomy on 01/22/2013  Discharge Exam: Blood pressure 113/68, pulse 94, temperature 98.7 F (37.1 C), temperature source Oral, resp. rate 20, height 5\' 2"  (1.575 m), weight 72.122 kg (159 lb), SpO2 94.00%. General appearance: alert, cooperative and no distress Resp: clear to auscultation bilaterally Cardio: regular rate and rhythm, S1, S2 normal, no murmur, click, rub or gallop GI: Soft. Incisions healing well. JP drain with clear serous fluid, removed.  Disposition:  home     Medication List         ABREVA 10 % Crea  Generic drug:  Docosanol  Apply 1 application topically daily as needed (Cold Sores).     albuterol 108 (90 BASE) MCG/ACT inhaler  Commonly known as:  PROVENTIL HFA  Inhale 2 puffs into the lungs  every 6 (six) hours as needed.     DULoxetine 60 MG capsule  Commonly known as:  CYMBALTA  Take 60 mg by mouth daily.     HYDROcodone-acetaminophen 7.5-325 MG per tablet  Commonly known as:  NORCO  Take 1 tablet by mouth every 4 (four) hours as needed for pain.     hydroxypropyl methylcellulose 2.5 % ophthalmic solution  Commonly known as:  ISOPTO TEARS  Place 1 drop into both eyes daily as needed (Dry Eyes).     promethazine 50 MG tablet  Commonly known as:  PHENERGAN  Take 0.5 tablets (25 mg total) by mouth every 6 (six) hours as needed for nausea.           Follow-up Information   Follow up with Fabio Bering, MD. Schedule an appointment as soon as possible for a visit on 02/04/2013.   Specialty:  General Surgery   Contact information:   [address] [place] [address] [phone]       Signed: Franky Macho A 01/28/2013, 8:46 AM

## 2014-11-03 IMAGING — CR DG CHEST 1V PORT
1 series · 1 of 1 positions shown · non-contrast
Comparison: 05/28/2005

CLINICAL DATA: Preop appendicitis

EXAM:
PORTABLE CHEST - 1 VIEW

[portable]
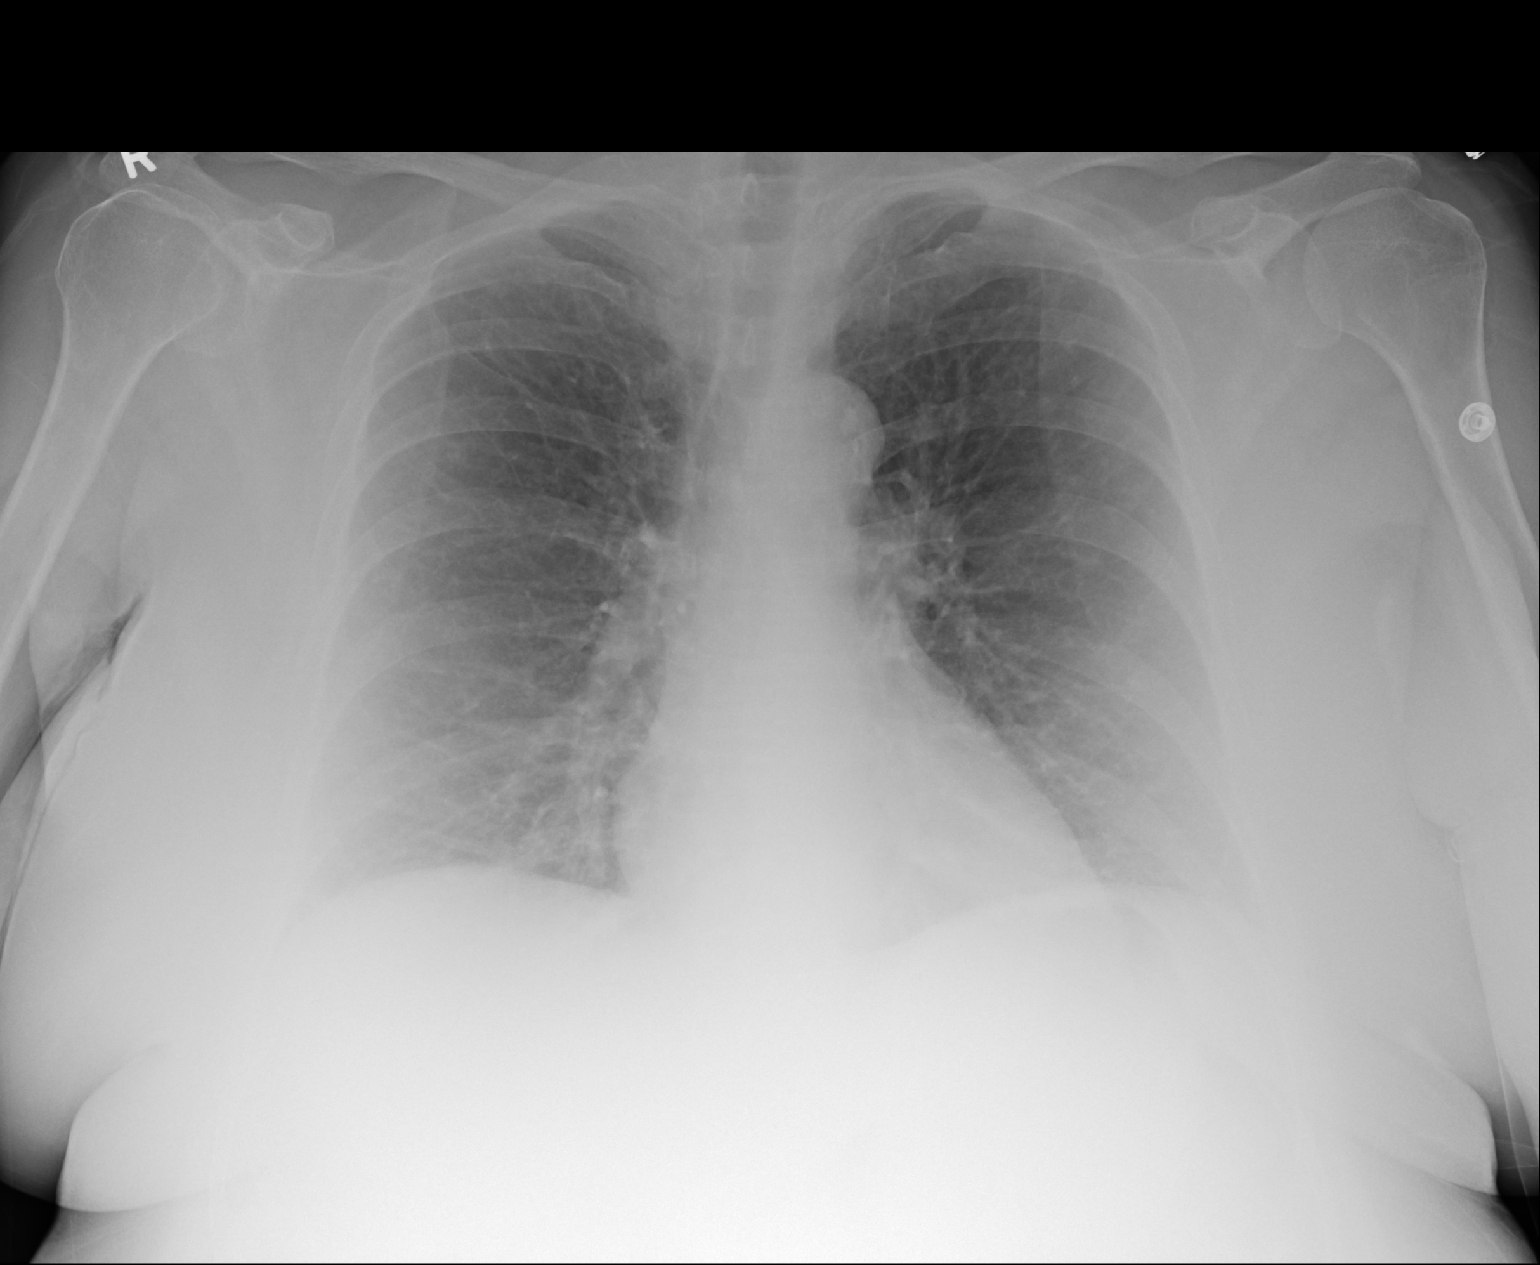

[1 of 1 positions shown; findings below may reference images not displayed]

FINDINGS: The lungs are clear without infiltrate or effusion. Negative for
heart failure or mass lesion.
IMPRESSION: No active disease.

## 2015-11-01 DIAGNOSIS — F329 Major depressive disorder, single episode, unspecified: Secondary | ICD-10-CM | POA: Diagnosis not present

## 2016-01-22 ENCOUNTER — Emergency Department (HOSPITAL_COMMUNITY): Payer: PPO

## 2016-01-22 ENCOUNTER — Emergency Department (HOSPITAL_COMMUNITY)
Admission: EM | Admit: 2016-01-22 | Discharge: 2016-01-22 | Disposition: A | Discharge status: Home / Self Care | Payer: PPO | Attending: Emergency Medicine | Admitting: Emergency Medicine

## 2016-01-22 ENCOUNTER — Encounter (HOSPITAL_COMMUNITY): Payer: Self-pay | Admitting: *Deleted

## 2016-01-22 DIAGNOSIS — Z87891 Personal history of nicotine dependence: Secondary | ICD-10-CM | POA: Insufficient documentation

## 2016-01-22 DIAGNOSIS — Y939 Activity, unspecified: Secondary | ICD-10-CM | POA: Diagnosis not present

## 2016-01-22 DIAGNOSIS — J449 Chronic obstructive pulmonary disease, unspecified: Secondary | ICD-10-CM | POA: Insufficient documentation

## 2016-01-22 DIAGNOSIS — S82831A Other fracture of upper and lower end of right fibula, initial encounter for closed fracture: Secondary | ICD-10-CM

## 2016-01-22 DIAGNOSIS — Z79899 Other long term (current) drug therapy: Secondary | ICD-10-CM | POA: Insufficient documentation

## 2016-01-22 DIAGNOSIS — S8261XA Displaced fracture of lateral malleolus of right fibula, initial encounter for closed fracture: Secondary | ICD-10-CM | POA: Insufficient documentation

## 2016-01-22 DIAGNOSIS — Y92009 Unspecified place in unspecified non-institutional (private) residence as the place of occurrence of the external cause: Secondary | ICD-10-CM | POA: Diagnosis not present

## 2016-01-22 DIAGNOSIS — Y999 Unspecified external cause status: Secondary | ICD-10-CM | POA: Diagnosis not present

## 2016-01-22 DIAGNOSIS — W19XXXA Unspecified fall, initial encounter: Secondary | ICD-10-CM | POA: Insufficient documentation

## 2016-01-22 DIAGNOSIS — S99911A Unspecified injury of right ankle, initial encounter: Secondary | ICD-10-CM | POA: Diagnosis not present

## 2016-01-22 MED ORDER — HYDROCODONE-ACETAMINOPHEN 5-325 MG PO TABS: 2.0000 | ORAL_TABLET | ORAL | 0 refills | Status: DC | PRN

## 2016-01-22 NOTE — ED Triage Notes (Signed)
Pain in right ankle, fell yesterday at home

## 2016-01-23 NOTE — ED Provider Notes (Signed)
[facility] DEPT Provider Note   CSN: [id number] Arrival date & time: 01/22/16  1246     History   Chief Complaint Chief Complaint  Patient presents with  . Ankle Pain    HPI Suzanne Walker is a 65 y.o. female.  The history is provided by the patient. No language interpreter was used.  Ankle Pain   The incident occurred yesterday. The incident occurred at home. The injury mechanism was torsion. The pain is present in the right ankle. The pain is at a severity of 5/10. The pain is moderate. The pain has been constant since onset. She reports no foreign bodies present. She has tried nothing for the symptoms. The treatment provided no relief.  Pt complains of swelling and pain  Past Medical History:  Diagnosis Date  . Anxiety disorder   . COPD (chronic obstructive pulmonary disease) ([place])   . Depression   . Gastroparesis   . GERD (gastroesophageal reflux disease)   . Hyperlipidemia     Patient Active Problem List   Diagnosis Date Noted  . Adenomatous polyps 05/10/2011  . HYPERLIPIDEMIA 01/24/2008  . HYPOKALEMIA 01/24/2008  . ANXIETY NEUROSIS 01/24/2008  . SUICIDE RISK 01/24/2008  . DEPRESSION 01/24/2008  . CHRONIC OBSTRUCTIVE PULMONARY DISEASE 01/24/2008  . GASTROPARESIS 01/24/2008  . HIATAL HERNIA 01/24/2008  . WEIGHT GAIN 01/24/2008  . NAUSEA 01/24/2008  . VOMITING 01/24/2008  . HEPATOMEGALY 01/24/2008    Past Surgical History:  Procedure Laterality Date  . ABDOMINAL HYSTERECTOMY    . BACK SURGERY     x 2  . COLONOSCOPY  10/10/07   adenomatous polyps  . Cyst removed     from finger  . LAPAROSCOPIC APPENDECTOMY N/A 01/22/2013   Procedure: APPENDECTOMY LAPAROSCOPIC;  Surgeon: Donato Heinz, MD;  Location: [facility];  Service: General;  Laterality: N/A;  . s/p hysterectomy    . TONSILLECTOMY    . TUBAL LIGATION      OB History    No data available       Home Medications    Prior to Admission medications   Medication Sig Start Date End Date  Taking? Authorizing Provider  albuterol (PROVENTIL HFA) 108 (90 BASE) MCG/ACT inhaler Inhale 2 puffs into the lungs every 6 (six) hours as needed. 01/28/13   Aviva Signs, MD  Docosanol (ABREVA) 10 % CREA Apply 1 application topically daily as needed (Cold Sores).    Historical Provider, MD  DULoxetine (CYMBALTA) 60 MG capsule Take 60 mg by mouth daily.      Historical Provider, MD  HYDROcodone-acetaminophen (NORCO/VICODIN) 5-325 MG tablet Take 2 tablets by mouth every 4 (four) hours as needed. 01/22/16   Fransico Meadow, PA-C  hydroxypropyl methylcellulose (ISOPTO TEARS) 2.5 % ophthalmic solution Place 1 drop into both eyes daily as needed (Dry Eyes).    Historical Provider, MD  promethazine (PHENERGAN) 50 MG tablet Take 0.5 tablets (25 mg total) by mouth every 6 (six) hours as needed for nausea. 01/28/13   Aviva Signs, MD    Family History No family history on file.  Social History Social History  Substance Use Topics  . Smoking status: Former Smoker    Packs/day: 0.50    Years: 30.00    Types: Cigarettes    Quit date: 01/23/2004  . Smokeless tobacco: Never Used  . Alcohol use No     Allergies   Ibuprofen   Review of Systems Review of Systems  All other systems reviewed and are negative.    Physical  Exam Updated Vital Signs BP 136/70 (BP Location: Left Arm)   Pulse 88   Temp 98.3 F (36.8 C) (Oral)   Resp 18   Ht 5\' 4"  (1.626 m)   Wt 68 kg   SpO2 98%   BMI 25.75 kg/m   Physical Exam  Constitutional: She appears well-developed and well-nourished. No distress.  HENT:  Head: Normocephalic and atraumatic.  Eyes: Conjunctivae are normal.  Cardiovascular: Normal rate.   No murmur heard. Pulmonary/Chest: No respiratory distress.  Abdominal: There is no tenderness.  Musculoskeletal: She exhibits tenderness. She exhibits no edema.  Swollen bruised right ankle, nv and ns intact  Negative thompson's  Neurological: She is alert.  Skin: Skin is warm and dry.    Psychiatric: She has a normal mood and affect.  Nursing note and vitals reviewed.    ED Treatments / Results  Labs (all labs ordered are listed, but only abnormal results are displayed) Labs Reviewed - No data to display  EKG  EKG Interpretation None       Radiology Dg Ankle Complete Right  Result Date: 01/22/2016 CLINICAL DATA:  FELL, PAIN RIGHT ANKLE, Pain in right ankle, fell yesterday at home. HISTORY OF COPD, GERD EXAM: RIGHT ANKLE - COMPLETE 3+ VIEW COMPARISON:  None. FINDINGS: Minimally displaced fracture within the lateral malleolus. Distal tibia appears intact and normally aligned. Ankle mortise is symmetric. Soft tissue swelling noted over the lateral malleolus. IMPRESSION: Minimally displaced fracture within the lateral malleolus, with overlying soft tissue swelling. Electronically Signed   By: Franki Cabot M.D.   On: 01/22/2016 13:32    Procedures Procedures (including critical care time)  Medications Ordered in ED Medications - No data to display   Initial Impression / Assessment and Plan / ED Course  I have reviewed the triage vital signs and the nursing notes.  Pertinent labs & imaging results that were available during my care of the patient were reviewed by me and considered in my medical decision making (see chart for details).  Clinical Course   Cam walker. Crutches,  Follow up with Dr. Aline Brochure for recheck  Final Clinical Impressions(s) / ED Diagnoses   Final diagnoses:  Traumatic closed nondisplaced fracture of distal fibula, right, initial encounter   No outpatient prescriptions have been marked as taking for the 01/22/16 encounter [facility] Encounter).   Meds ordered this encounter  Medications  . HYDROcodone-acetaminophen (NORCO/VICODIN) 5-325 MG tablet    Sig: Take 2 tablets by mouth every 4 (four) hours as needed.    Dispense:  16 tablet    Refill:  0    Order Specific Question:   Supervising Provider    Answer:   Noemi Chapel [id number]    New Prescriptions Discharge Medication List as of 01/22/2016  1:55 PM    An After Visit Summary was printed and given to the patient.   Hollace Kinnier [place], PA-C 01/23/16 [id number]    Milton Ferguson, MD 01/26/16 1226

## 2016-01-25 ENCOUNTER — Ambulatory Visit (INDEPENDENT_AMBULATORY_CARE_PROVIDER_SITE_OTHER): Payer: PPO | Admitting: Orthopedic Surgery

## 2016-01-25 VITALS — BP 138/81 | HR 109 | Ht 62.0 in | Wt 159.0 lb

## 2016-01-25 DIAGNOSIS — S8264XA Nondisplaced fracture of lateral malleolus of right fibula, initial encounter for closed fracture: Secondary | ICD-10-CM | POA: Diagnosis not present

## 2016-01-25 MED ORDER — HYDROCODONE-ACETAMINOPHEN 5-325 MG PO TABS: 2.0000 | ORAL_TABLET | Freq: Four times a day (QID) | ORAL | 0 refills | Status: DC | PRN

## 2016-01-25 NOTE — Progress Notes (Signed)
Patient ID: Suzanne Walker, female   DOB: November 12, 1950, 65 y.o.   MRN: [id number]  Chief Complaint  Patient presents with  . Follow-up    ER follow up on right ankle fracture, DOI 01-21-16.    HPI Suzanne Walker is a 65 y.o. female.  Presents for evaluation of her right ankle fracture HPI 4 days ago she slipped on the wet grass injured her right ankle went to the ER she has a nondisplaced fibular fracture. Complains of moderate dull aching pain on the lateral malleolus which is constant  She was placed in a Cam Walker she is comfortable Review of Systems Review of Systems  Constitutional: Negative for fever.  Musculoskeletal: Positive for gait problem and joint swelling.     Past Medical History:  Diagnosis Date  . Anxiety disorder   . COPD (chronic obstructive pulmonary disease) ([place])   . Depression   . Gastroparesis   . GERD (gastroesophageal reflux disease)   . Hyperlipidemia     Past Surgical History:  Procedure Laterality Date  . ABDOMINAL HYSTERECTOMY    . BACK SURGERY     x 2  . COLONOSCOPY  10/10/07   adenomatous polyps  . Cyst removed     from finger  . LAPAROSCOPIC APPENDECTOMY N/A 01/22/2013   Procedure: APPENDECTOMY LAPAROSCOPIC;  Surgeon: Donato Heinz, MD;  Location: [facility];  Service: General;  Laterality: N/A;  . s/p hysterectomy    . TONSILLECTOMY    . TUBAL LIGATION        Social History Social History  Substance Use Topics  . Smoking status: Former Smoker    Packs/day: 0.50    Years: 30.00    Types: Cigarettes    Quit date: 01/23/2004  . Smokeless tobacco: Never Used  . Alcohol use No    Allergies  Allergen Reactions  . Ibuprofen Other (See Comments)    Unknown reaction    Current Outpatient Prescriptions  Medication Sig Dispense Refill  . albuterol (PROVENTIL HFA) 108 (90 BASE) MCG/ACT inhaler Inhale 2 puffs into the lungs every 6 (six) hours as needed. 3.7 Inhaler 2  . Docosanol (ABREVA) 10 % CREA Apply 1 application topically  daily as needed (Cold Sores).    . DULoxetine (CYMBALTA) 60 MG capsule Take 60 mg by mouth daily.      Marland Kitchen HYDROcodone-acetaminophen (NORCO/VICODIN) 5-325 MG tablet Take 2 tablets by mouth every 6 (six) hours as needed. 56 tablet 0  . hydroxypropyl methylcellulose (ISOPTO TEARS) 2.5 % ophthalmic solution Place 1 drop into both eyes daily as needed (Dry Eyes).    . promethazine (PHENERGAN) 50 MG tablet Take 0.5 tablets (25 mg total) by mouth every 6 (six) hours as needed for nausea. 30 tablet 0   No current facility-administered medications for this visit.        Physical Exam Blood pressure 138/81, pulse (!) 109, height 5\' 2"  (1.575 m), weight 159 lb (72.1 kg). Physical Exam Ambulatory status Crutches and Cam Walker full weightbearing  On the right ankle we see swelling and tenderness over the malleolus laterally, medial malleolus nontender. Passive range of motion normal mild pain stability test and anterior drawer normal motor exam shows mild weakness all planes  Neurovascular examination is intact  Lymph node palpation is normal  The opposite extremity exhibits normal range of motion stability and strength neurovascular exam is intact, lymph nodes are negative and there is no swelling or tenderness   Data Reviewed  independent image interpretation :  Nondisplaced  fibular fracture with intact mortise  Assessment    Encounter Diagnosis  Name Primary?  . Closed nondisplaced fracture of lateral malleolus of right fibula, initial encounter Yes      Plan    Aircast, weight-bear as tolerated return in 4 weeks repeat x-ray  Arther Abbott, MD 01/25/2016 4:18 PM

## 2016-02-22 ENCOUNTER — Ambulatory Visit (INDEPENDENT_AMBULATORY_CARE_PROVIDER_SITE_OTHER): Payer: PPO

## 2016-02-22 ENCOUNTER — Ambulatory Visit (INDEPENDENT_AMBULATORY_CARE_PROVIDER_SITE_OTHER): Payer: PPO | Admitting: Orthopedic Surgery

## 2016-02-22 DIAGNOSIS — G8929 Other chronic pain: Secondary | ICD-10-CM

## 2016-02-22 DIAGNOSIS — S8264XD Nondisplaced fracture of lateral malleolus of right fibula, subsequent encounter for closed fracture with routine healing: Secondary | ICD-10-CM

## 2016-02-22 DIAGNOSIS — F329 Major depressive disorder, single episode, unspecified: Secondary | ICD-10-CM | POA: Diagnosis not present

## 2016-02-22 DIAGNOSIS — Z981 Arthrodesis status: Secondary | ICD-10-CM | POA: Diagnosis not present

## 2016-02-22 DIAGNOSIS — M545 Low back pain: Secondary | ICD-10-CM

## 2016-02-22 MED ORDER — PREDNISONE 10 MG PO TABS: 10.0000 mg | ORAL_TABLET | Freq: Two times a day (BID) | ORAL | 0 refills | Status: DC

## 2016-02-22 MED ORDER — METHOCARBAMOL 500 MG PO TABS: 500.0000 mg | ORAL_TABLET | Freq: Three times a day (TID) | ORAL | 1 refills | Status: DC

## 2016-02-22 NOTE — Progress Notes (Signed)
Patient ID: Suzanne Walker, female   DOB: [birth year], 65 y.o.   MRN: [id number]  Fracture care  Chief Complaint  Patient presents with  . Follow-up    RT ankel Fx      Plan ankle fracture treated with immobilization. X-ray shows fracture healing  Patient released  Patient has new complaint of lower back pain

## 2016-02-22 NOTE — Patient Instructions (Addendum)
APPLY HEAT 3 X A DAY   Meds ordered this encounter  Medications  . methocarbamol (ROBAXIN) 500 MG tablet    Sig: Take 1 tablet (500 mg total) by mouth 3 (three) times daily.    Dispense:  60 tablet    Refill:  1  . predniSONE (DELTASONE) 10 MG tablet    Sig: Take 1 tablet (10 mg total) by mouth 2 (two) times daily with a meal.    Dispense:  60 tablet    Refill:  0

## 2016-02-22 NOTE — Progress Notes (Signed)
Patient ID: Suzanne Walker, female   DOB: 1950/12/23, 65 y.o.   MRN: [id number]  Chief Complaint  Patient presents with  . Follow-up    RT ankel Fx    HPI Suzanne Walker is a 65 y.o. female.   HPI 65 year old female status post lumbar fusion with new chief complaint of lower back pain for 3 weeks. 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 Her pain started after she was limping on her right leg status post ankle fracture  Review of Systems Review of Systems No fever chills or malaise Examination There were no vitals taken for this visit.  Gen. appearance the patient's appearance is normal with normal grooming and  hygiene The patient is oriented to person place and time Mood and affect are normal   Ortho Exam No specific leg weakness or numbness in the left lower extremity pulse normal  Ambulates with no significant limp    Medical decision-making Diagnosis, Data, Plan (risk)  Encounter Diagnoses  Name Primary?  . Closed nondisplaced fracture of lateral malleolus of right fibula with routine healing   . Chronic midline low back pain without sciatica Yes  . S/P lumbar fusion     Recommend medication if no improvement have primary care sent for spine specialist to evaluate  Arther Abbott, MD 02/22/2016 10:15 AM

## 2016-02-24 ENCOUNTER — Other Ambulatory Visit: Payer: Self-pay | Admitting: *Deleted

## 2016-02-24 MED ORDER — TIZANIDINE HCL 4 MG PO TABS: 4.0000 mg | ORAL_TABLET | Freq: Three times a day (TID) | ORAL | 1 refills | Status: DC | PRN

## 2016-02-24 NOTE — Telephone Encounter (Signed)
New medication sent to pharmacy, patient aware

## 2016-02-24 NOTE — Progress Notes (Unsigned)
me

## 2016-02-24 NOTE — Telephone Encounter (Signed)
Tizanidine 4 mg q 8 # 60 1 refill

## 2016-02-24 NOTE — Telephone Encounter (Signed)
Patients insurance is requiring prior auth for Robaxin. Would you like to change to something else or complete prior auth?

## 2016-06-12 DIAGNOSIS — F341 Dysthymic disorder: Secondary | ICD-10-CM | POA: Diagnosis not present

## 2016-07-27 ENCOUNTER — Encounter: Payer: Self-pay | Admitting: Family Medicine

## 2016-07-27 ENCOUNTER — Ambulatory Visit (INDEPENDENT_AMBULATORY_CARE_PROVIDER_SITE_OTHER): Payer: PPO | Admitting: Family Medicine

## 2016-07-27 VITALS — BP 140/82 | HR 100 | Temp 97.0°F | Resp 18 | Ht 62.0 in | Wt 151.0 lb

## 2016-07-27 DIAGNOSIS — D369 Benign neoplasm, unspecified site: Secondary | ICD-10-CM | POA: Diagnosis not present

## 2016-07-27 DIAGNOSIS — Z780 Asymptomatic menopausal state: Secondary | ICD-10-CM

## 2016-07-27 DIAGNOSIS — F411 Generalized anxiety disorder: Secondary | ICD-10-CM | POA: Diagnosis not present

## 2016-07-27 DIAGNOSIS — M4726 Other spondylosis with radiculopathy, lumbar region: Secondary | ICD-10-CM | POA: Diagnosis not present

## 2016-07-27 DIAGNOSIS — Z1239 Encounter for other screening for malignant neoplasm of breast: Secondary | ICD-10-CM

## 2016-07-27 DIAGNOSIS — Z7689 Persons encountering health services in other specified circumstances: Secondary | ICD-10-CM | POA: Diagnosis not present

## 2016-07-27 DIAGNOSIS — J431 Panlobular emphysema: Secondary | ICD-10-CM | POA: Diagnosis not present

## 2016-07-27 DIAGNOSIS — Z1231 Encounter for screening mammogram for malignant neoplasm of breast: Secondary | ICD-10-CM | POA: Diagnosis not present

## 2016-07-27 DIAGNOSIS — E559 Vitamin D deficiency, unspecified: Secondary | ICD-10-CM | POA: Diagnosis not present

## 2016-07-27 LAB — LIPID PANEL
CHOL/HDL RATIO: 3.9 ratio (ref <5.0)
CHOLESTEROL: 290 mg/dL — AB (ref <200)
HDL: 75 mg/dL (ref >50)
LDL CALC: 188 mg/dL — AB (ref <100)
TRIGLYCERIDES: 136 mg/dL (ref <150)
VLDL: 27 mg/dL (ref <30)

## 2016-07-27 LAB — COMPREHENSIVE METABOLIC PANEL
ALBUMIN: 4.6 g/dL (ref 3.6–5.1)
ALT: 10 U/L (ref 6–29)
AST: 12 U/L (ref 10–35)
Alkaline Phosphatase: 100 U/L (ref 33–130)
BUN: 7 mg/dL (ref 7–25)
CHLORIDE: 103 mmol/L (ref 98–110)
CO2: 25 mmol/L (ref 20–31)
CREATININE: 0.76 mg/dL (ref 0.50–0.99)
Calcium: 9.2 mg/dL (ref 8.6–10.4)
Glucose, Bld: 93 mg/dL (ref 65–99)
POTASSIUM: 4.1 mmol/L (ref 3.5–5.3)
SODIUM: 140 mmol/L (ref 135–146)
TOTAL PROTEIN: 7.2 g/dL (ref 6.1–8.1)
Total Bilirubin: 0.5 mg/dL (ref 0.2–1.2)

## 2016-07-27 LAB — CBC
HCT: 42.8 % (ref 35.0–45.0)
HEMOGLOBIN: 14.4 g/dL (ref 11.7–15.5)
MCH: 28.7 pg (ref 27.0–33.0)
MCHC: 33.6 g/dL (ref 32.0–36.0)
MCV: 85.4 fL (ref 80.0–100.0)
MPV: 10.2 fL (ref 7.5–12.5)
Platelets: 302 10*3/uL (ref 140–400)
RBC: 5.01 MIL/uL (ref 3.80–5.10)
RDW: 13.8 % (ref 11.0–15.0)
WBC: 6.5 10*3/uL (ref 3.8–10.8)

## 2016-07-27 MED ORDER — CYMBALTA 30 MG PO CPEP: 30.0000 mg | ORAL_CAPSULE | Freq: Every day | ORAL | 11 refills | Status: DC

## 2016-07-27 NOTE — Patient Instructions (Signed)
Walk every day that you are able I have ordered mammogram and bone density test I have ordered lab testing I refilled the Cymbalta See me in [facility]

## 2016-07-27 NOTE — Progress Notes (Signed)
Chief Complaint  Patient presents with  . Establish Care    no care in atleast 6 years    Patient is new to establish. She has not seen a medical doctor in at least 6 years. She is [facility] for depression and is on a generic duloxetine. She states that she prefers namebrand duloxetine because it is more effective. I told her I am able to provide a prescription for this, however, she will have a higher charge at the pharmacy. A prescription will indicate that the name brand is her preference and not medically necessary. She is not up-to-date with mammograms. She's never had a DEXA scan. Her colonoscopy was in 2009, not due until next year. She has an uncertain immunization status. She refuses flu shot, Prevnar shot, and tetanus. She did not get a shingles shot. She is on a regular diet. She does not get any regular exercise. She had back surgery many years ago and states she has "permanently damaged nerves into the leg". She previously was a smoker and has known COPD/emphysema. She is not on any medication. She is a history of GERD and gastroparesis. She is currently not on any medication for this. She states that her GERD feels better while taking duloxetine.  Patient Active Problem List   Diagnosis Date Noted  . Anxiety, generalized 07/27/2016  . Adenomatous polyps 05/10/2011  . HYPERLIPIDEMIA 01/24/2008  . Emphysema/COPD ([place]) 01/24/2008  . GASTROPARESIS 01/24/2008  . HEPATOMEGALY 01/24/2008    Outpatient Encounter Prescriptions as of 07/27/2016  Medication Sig  . CYMBALTA 30 MG capsule Take 1 capsule (30 mg total) by mouth daily.   No facility-administered encounter medications on file as of 07/27/2016.     Past Medical History:  Diagnosis Date  . Allergy    mild hay fever  . Anxiety   . Anxiety disorder   . Blood transfusion without reported diagnosis    hysterectomy  . COPD (chronic obstructive pulmonary disease) ([place])   . Depression   . Emphysema of lung ([place])    . Gastroparesis   . Hyperlipidemia     Past Surgical History:  Procedure Laterality Date  . ABDOMINAL HYSTERECTOMY     bleeding  . BACK SURGERY     x 2  . COLONOSCOPY  10/10/07   adenomatous polyps  . Cyst removed     from finger  . LAPAROSCOPIC APPENDECTOMY N/A 01/22/2013   Procedure: APPENDECTOMY LAPAROSCOPIC;  Surgeon: Donato Heinz, MD;  Location: [facility];  Service: General;  Laterality: N/A;  . s/p hysterectomy    . SPINE SURGERY     low back  . TONSILLECTOMY    . TUBAL LIGATION      Social History   Social History  . Marital status: Widowed    Spouse name: N/A  . Number of children: 2  . Years of education: 12   Occupational History  . retired    Social History Main Topics  . Smoking status: Former Smoker    Packs/day: 0.50    Years: 30.00    Types: Cigarettes    Quit date: 01/23/2004  . Smokeless tobacco: Never Used  . Alcohol use No  . Drug use: No  . Sexual activity: Not Currently    Birth control/ protection: Surgical     Comment: widow   Other Topics Concern  . Not on file   Social History Narrative   Lives alone   Widow   Estranged from children    Family  History  Problem Relation Age of Onset  . Cancer Mother     bone  . Heart disease Mother   . Hypertension Mother   . Cancer Father     throat  . Alcohol abuse Father   . Thyroid disease Sister   . Drug abuse Daughter   . Heart disease Sister 70    Review of Systems  Constitutional: Negative for chills, fever and weight loss.  HENT: Negative for congestion and hearing loss.   Eyes: Negative for blurred vision and pain.  Respiratory: Negative for cough and shortness of breath.   Cardiovascular: Negative for chest pain and leg swelling.  Gastrointestinal: Negative for abdominal pain, constipation, diarrhea and heartburn.  Genitourinary: Negative for dysuria and frequency.  Musculoskeletal: Positive for back pain. Negative for falls, joint pain and myalgias.       Chronic    Neurological: Negative for dizziness, seizures and headaches.  Psychiatric/Behavioral: Positive for depression. The patient is nervous/anxious. The patient does not have insomnia.        Under care of psychiatry    BP 140/82 (BP Location: Right Arm, Patient Position: Sitting, Cuff Size: Normal)   Pulse 100   Temp 97 F (36.1 C) (Temporal)   Resp 18   Ht 5\' 2"  (1.575 m)   Wt 151 lb 0.6 oz (68.5 kg)   SpO2 91%   BMI 27.63 kg/m   Physical Exam  Constitutional: She is oriented to person, place, and time. She appears well-developed and well-nourished.  HENT:  Head: Normocephalic and atraumatic.  Right Ear: External ear normal.  Left Ear: External ear normal.  Mouth/Throat: Oropharynx is clear and moist.  Needs dental repair  Eyes: Conjunctivae are normal. Pupils are equal, round, and reactive to light.  Neck: Normal range of motion. Neck supple. No thyromegaly present.  Cardiovascular: Normal rate, regular rhythm and normal heart sounds.   Pulmonary/Chest: Effort normal and breath sounds normal. No respiratory distress.  Abdominal: Soft. Bowel sounds are normal.  Abdominal obesity  Musculoskeletal: Normal range of motion. She exhibits no edema.  Lymphadenopathy:    She has no cervical adenopathy.  Neurological: She is alert and oriented to person, place, and time.  Gait normal  Skin: Skin is warm and dry.  Psychiatric: She has a normal mood and affect. Her behavior is normal. Thought content normal.  Nursing note and vitals reviewed.  ASSESSMENT/PLAN:  1. Adenomatous polyps  2. Panlobular emphysema ([place])  3. Anxiety, generalized - CBC - Comprehensive metabolic panel - Lipid panel - VITAMIN D 25 Hydroxy (Vit-D Deficiency, Fractures) - Urinalysis, Routine w reflex microscopic - Hemoglobin A1c  4. Screening for breast cancer - MM Digital Screening; Future  5. Post-menopausal - VITAMIN D 25 Hydroxy (Vit-D Deficiency, Fractures) - DG Bone Density; Future  6.  Osteoarthritis of spine with radiculopathy, lumbar region - VITAMIN D 25 Hydroxy (Vit-D Deficiency, Fractures)  7. Encounter to establish care with new doctor   Patient Instructions  Walk every day that you are able I have ordered mammogram and bone density test I have ordered lab testing I refilled the Cymbalta See me in [facility], [place]

## 2016-07-28 ENCOUNTER — Encounter: Payer: Self-pay | Admitting: Family Medicine

## 2016-07-28 LAB — URINALYSIS, ROUTINE W REFLEX MICROSCOPIC
BILIRUBIN URINE: NEGATIVE
GLUCOSE, UA: NEGATIVE
HGB URINE DIPSTICK: NEGATIVE
KETONES UR: NEGATIVE
Leukocytes, UA: NEGATIVE
NITRITE: NEGATIVE
PH: 5.5 (ref 5.0–8.0)
Protein, ur: NEGATIVE
SPECIFIC GRAVITY, URINE: 1.006 (ref 1.001–1.035)

## 2016-07-28 LAB — HEMOGLOBIN A1C
Hgb A1c MFr Bld: 5.6 % (ref <5.7)
MEAN PLASMA GLUCOSE: 114 mg/dL

## 2016-07-28 LAB — VITAMIN D 25 HYDROXY (VIT D DEFICIENCY, FRACTURES): Vit D, 25-Hydroxy: 7 ng/mL — ABNORMAL LOW (ref 30–100)

## 2016-07-28 MED ORDER — VITAMIN D (ERGOCALCIFEROL) 1.25 MG (50000 UNIT) PO CAPS: 50000.0000 [IU] | ORAL_CAPSULE | ORAL | 0 refills | Status: DC

## 2016-08-24 ENCOUNTER — Encounter: Payer: Self-pay | Admitting: Family Medicine

## 2016-08-24 ENCOUNTER — Ambulatory Visit (INDEPENDENT_AMBULATORY_CARE_PROVIDER_SITE_OTHER): Payer: PPO | Admitting: Family Medicine

## 2016-08-24 VITALS — BP 138/78 | HR 108 | Temp 97.7°F | Resp 18 | Ht 62.0 in | Wt 150.1 lb

## 2016-08-24 DIAGNOSIS — E785 Hyperlipidemia, unspecified: Secondary | ICD-10-CM

## 2016-08-24 DIAGNOSIS — E559 Vitamin D deficiency, unspecified: Secondary | ICD-10-CM

## 2016-08-24 DIAGNOSIS — F411 Generalized anxiety disorder: Secondary | ICD-10-CM | POA: Diagnosis not present

## 2016-08-24 NOTE — Patient Instructions (Signed)
Take the vitamin D 50000 once a week for 12 weeks After this take 2000 u a day over the counter Get fish oil 1000 mg, take 2 pills a day continue the cymbalta  Need labs and a check up in six months Call sooner for problems

## 2016-08-24 NOTE — Progress Notes (Signed)
Chief Complaint  Patient presents with  . Follow-up    1 month  Patient is here for routine follow-up. She has no acute complaints. We discussed her recent blood work. The results are shown to her. Everything is normal or as expected with the exception of significant vitamin D deficiency. Discussed vitamin D absorption, intake, sunshine, and the need to take supplementation. We also discussed her hyperlipidemia. She has significant hyperlipidemia with an LDL of 188. I pulled up the cardiology calculators and showed her her Fr[place]ham risk of developing cardiovascular disease in the next 10 years. It is 11.7%. I advised her to start a statin. She wishes not to. She is going to try diet, exercise, and fish oil and we will reevaluate in 6 months. Her anxiety is reasonably well-controlled on the duloxetine 30 mg a day.   Patient Active Problem List   Diagnosis Date Noted  . Vitamin D deficiency 08/24/2016  . Anxiety, generalized 07/27/2016  . Adenomatous polyps 05/10/2011  . HLD (hyperlipidemia) 01/24/2008  . Emphysema/COPD ([place]) 01/24/2008  . GASTROPARESIS 01/24/2008  . HEPATOMEGALY 01/24/2008    Outpatient Encounter Prescriptions as of 08/24/2016  Medication Sig  . CYMBALTA 30 MG capsule Take 1 capsule (30 mg total) by mouth daily.  . Vitamin D, Ergocalciferol, (DRISDOL) 50000 units CAPS capsule Take 1 capsule (50,000 Units total) by mouth every 7 (seven) days.   No facility-administered encounter medications on file as of 08/24/2016.     Allergies  Allergen Reactions  . Ibuprofen Other (See Comments)    Unknown reaction    Review of Systems  Constitutional: Negative for activity change, appetite change and unexpected weight change.  HENT: Negative for congestion, dental problem, postnasal drip and rhinorrhea.   Eyes: Negative for redness and visual disturbance.  Respiratory: Negative for cough and shortness of breath.   Cardiovascular: Negative for chest pain, palpitations  and leg swelling.  Gastrointestinal: Negative for abdominal pain, constipation and diarrhea.  Genitourinary: Negative for difficulty urinating and frequency.  Musculoskeletal: Negative for arthralgias and back pain.  Neurological: Negative for dizziness and headaches.  Psychiatric/Behavioral: Negative for dysphoric mood and sleep disturbance. The patient is nervous/anxious.     Results for ROANN, MERK (MRN [id number]) as of 08/24/2016 17:58  Ref. Range 07/27/2016 13:50  Sodium Latest Ref Range: 135 - 146 mmol/L 140  Potassium Latest Ref Range: 3.5 - 5.3 mmol/L 4.1  Chloride Latest Ref Range: 98 - 110 mmol/L 103  CO2 Latest Ref Range: 20 - 31 mmol/L 25  Glucose Latest Ref Range: 65 - 99 mg/dL 93  Mean Plasma Glucose Latest Units: mg/dL 114  BUN Latest Ref Range: 7 - 25 mg/dL 7  Creatinine Latest Ref Range: 0.50 - 0.99 mg/dL 0.76  Calcium Latest Ref Range: 8.6 - 10.4 mg/dL 9.2  Alkaline Phosphatase Latest Ref Range: 33 - 130 U/L 100  Albumin Latest Ref Range: 3.6 - 5.1 g/dL 4.6  AST Latest Ref Range: 10 - 35 U/L 12  ALT Latest Ref Range: 6 - 29 U/L 10  Total Protein Latest Ref Range: 6.1 - 8.1 g/dL 7.2  Total Bilirubin Latest Ref Range: 0.2 - 1.2 mg/dL 0.5  Total CHOL/HDL Ratio Latest Ref Range: <5.0 Ratio 3.9  Cholesterol Latest Ref Range: <200 mg/dL 290 (H)  HDL Cholesterol Latest Ref Range: >50 mg/dL 75  LDL (calc) Latest Ref Range: <100 mg/dL 188 (H)  Triglycerides Latest Ref Range: <150 mg/dL 136  VLDL Latest Ref Range: <30 mg/dL 27  Vitamin D,  25-Hydroxy Latest Ref Range: 30 - 100 ng/mL 7 (L)  WBC Latest Ref Range: 3.8 - 10.8 K/uL 6.5  RBC Latest Ref Range: 3.80 - 5.10 MIL/uL 5.01  Hemoglobin Latest Ref Range: 11.7 - 15.5 g/dL 14.4  HCT Latest Ref Range: 35.0 - 45.0 % 42.8  MCV Latest Ref Range: 80.0 - 100.0 fL 85.4  MCH Latest Ref Range: 27.0 - 33.0 pg 28.7  MCHC Latest Ref Range: 32.0 - 36.0 g/dL 33.6  RDW Latest Ref Range: 11.0 - 15.0 % 13.8  Platelets Latest Ref  Range: 140 - 400 K/uL 302  MPV Latest Ref Range: 7.5 - 12.5 fL 10.2  Hemoglobin A1C Latest Ref Range: <5.7 % 5.6  URINALYSIS, ROUTINE W REFLEX MICROSCOPIC Unknown Rpt  Appearance Latest Ref Range: CLEAR  CLEAR  Bilirubin Urine Latest Ref Range: NEGATIVE  NEGATIVE  Color, Urine Latest Ref Range: YELLOW  YELLOW  Glucose Latest Ref Range: NEGATIVE  NEGATIVE  Hgb urine dipstick Latest Ref Range: NEGATIVE  NEGATIVE  Ketones, ur Latest Ref Range: NEGATIVE  NEGATIVE  Leukocytes, UA Latest Ref Range: NEGATIVE  NEGATIVE  Nitrite Latest Ref Range: NEGATIVE  NEGATIVE  pH Latest Ref Range: 5.0 - 8.0  5.5  Protein Latest Ref Range: NEGATIVE  NEGATIVE  Specific Gravity, Urine Latest Ref Range: 1.001 - 1.035  1.006    BP 138/78   Pulse (!) 108   Temp 97.7 F (36.5 C) (Temporal)   Resp 18   Ht 5\' 2"  (1.575 m)   Wt 150 lb 1.3 oz (68.1 kg)   SpO2 95%   BMI 27.45 kg/m   Physical Exam  Constitutional: She is oriented to person, place, and time. She appears well-developed and well-nourished.  HENT:  Head: Normocephalic and atraumatic.  Right Ear: External ear normal.  Left Ear: External ear normal.  Mouth/Throat: Oropharynx is clear and moist.  Needs dental repair  Eyes: Conjunctivae are normal. Pupils are equal, round, and reactive to light.  Neck: Normal range of motion. Neck supple. No thyromegaly present.  Cardiovascular: Normal rate, regular rhythm and normal heart sounds.   Pulmonary/Chest: Effort normal and breath sounds normal. No respiratory distress.  Abdominal: Soft. Bowel sounds are normal.  Abdominal obesity- mild  Musculoskeletal: Normal range of motion. She exhibits no edema.  Lymphadenopathy:    She has no cervical adenopathy.  Neurological: She is alert and oriented to person, place, and time.  Gait normal  Skin: Skin is warm and dry.  Psychiatric: She has a normal mood and affect. Her behavior is normal. Thought content normal.  Mild emotional lability  Nursing note and  vitals reviewed.   ASSESSMENT/PLAN:  1. Vitamin D deficiency Discussed diet, replacement, sunshine  2. Hyperlipidemia, unspecified hyperlipidemia type Gave patient a copy of the cholesterol diet. Recommend daily walking. Take fish oil.  3. Anxiety, generalized Continue duloxetine. Continue seeing counselor.   Patient Instructions  Take the vitamin D 50000 once a week for 12 weeks After this take 2000 u a day over the counter Get fish oil 1000 mg, take 2 pills a day continue the cymbalta  Need labs and a check up in six months Call sooner for problems   Raylene Everts, MD

## 2016-10-18 ENCOUNTER — Other Ambulatory Visit: Payer: Self-pay | Admitting: Family Medicine

## 2017-02-22 ENCOUNTER — Other Ambulatory Visit: Payer: Self-pay

## 2017-02-22 ENCOUNTER — Ambulatory Visit (INDEPENDENT_AMBULATORY_CARE_PROVIDER_SITE_OTHER): Payer: PPO | Admitting: Family Medicine

## 2017-02-22 ENCOUNTER — Encounter: Payer: Self-pay | Admitting: Family Medicine

## 2017-02-22 VITALS — BP 138/88 | HR 92 | Temp 98.3°F | Resp 16 | Ht 62.0 in | Wt 148.1 lb

## 2017-02-22 DIAGNOSIS — J431 Panlobular emphysema: Secondary | ICD-10-CM | POA: Diagnosis not present

## 2017-02-22 DIAGNOSIS — R030 Elevated blood-pressure reading, without diagnosis of hypertension: Secondary | ICD-10-CM | POA: Diagnosis not present

## 2017-02-22 DIAGNOSIS — Z23 Encounter for immunization: Secondary | ICD-10-CM

## 2017-02-22 DIAGNOSIS — E785 Hyperlipidemia, unspecified: Secondary | ICD-10-CM

## 2017-02-22 DIAGNOSIS — F411 Generalized anxiety disorder: Secondary | ICD-10-CM | POA: Diagnosis not present

## 2017-02-22 MED ORDER — CYMBALTA 30 MG PO CPEP: 30.0000 mg | ORAL_CAPSULE | Freq: Every day | ORAL | 11 refills | Status: AC
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
## 2017-02-22 NOTE — Patient Instructions (Signed)
Work on the cholesterol Walk every day that you are able Stay on same medicines  Need labs and follow up in 6 months Remember to get your mammogram and dexa scan

## 2017-02-23 ENCOUNTER — Ambulatory Visit: Payer: PPO | Admitting: Family Medicine

## 2017-03-13 ENCOUNTER — Encounter: Payer: Self-pay | Admitting: Family Medicine

## 2017-04-10 ENCOUNTER — Other Ambulatory Visit: Payer: Self-pay | Admitting: Family Medicine

## 2017-04-16 ENCOUNTER — Encounter (HOSPITAL_COMMUNITY): Payer: Self-pay

## 2017-04-16 ENCOUNTER — Ambulatory Visit (HOSPITAL_COMMUNITY)
Admission: RE | Admit: 2017-04-16 | Discharge: 2017-04-16 | Disposition: A | Discharge status: Home / Self Care | Payer: PPO | Source: Ambulatory Visit | Attending: Family Medicine | Admitting: Family Medicine

## 2017-04-16 ENCOUNTER — Other Ambulatory Visit: Payer: Self-pay | Admitting: Family Medicine

## 2017-04-16 DIAGNOSIS — Z1231 Encounter for screening mammogram for malignant neoplasm of breast: Secondary | ICD-10-CM | POA: Diagnosis not present

## 2017-04-16 DIAGNOSIS — M810 Age-related osteoporosis without current pathological fracture: Secondary | ICD-10-CM | POA: Insufficient documentation

## 2017-04-16 DIAGNOSIS — Z780 Asymptomatic menopausal state: Secondary | ICD-10-CM | POA: Diagnosis not present

## 2017-04-16 DIAGNOSIS — Z1239 Encounter for other screening for malignant neoplasm of breast: Secondary | ICD-10-CM

## 2017-04-18 ENCOUNTER — Other Ambulatory Visit: Payer: Self-pay | Admitting: Family Medicine

## 2017-04-18 ENCOUNTER — Encounter: Payer: Self-pay | Admitting: Family Medicine

## 2017-04-18 DIAGNOSIS — R928 Other abnormal and inconclusive findings on diagnostic imaging of breast: Secondary | ICD-10-CM

## 2017-05-01 ENCOUNTER — Ambulatory Visit (HOSPITAL_COMMUNITY)
Admission: RE | Admit: 2017-05-01 | Discharge: 2017-05-01 | Disposition: A | Discharge status: Home / Self Care | Payer: PPO | Source: Ambulatory Visit | Attending: Family Medicine | Admitting: Family Medicine

## 2017-05-01 ENCOUNTER — Other Ambulatory Visit: Payer: Self-pay | Admitting: Family Medicine

## 2017-05-01 DIAGNOSIS — N6489 Other specified disorders of breast: Secondary | ICD-10-CM | POA: Diagnosis not present

## 2017-05-01 DIAGNOSIS — R922 Inconclusive mammogram: Secondary | ICD-10-CM | POA: Diagnosis not present

## 2017-05-01 DIAGNOSIS — R928 Other abnormal and inconclusive findings on diagnostic imaging of breast: Secondary | ICD-10-CM

## 2017-05-01 DIAGNOSIS — N6002 Solitary cyst of left breast: Secondary | ICD-10-CM | POA: Insufficient documentation

## 2017-05-01 DIAGNOSIS — N6001 Solitary cyst of right breast: Secondary | ICD-10-CM | POA: Diagnosis not present

## 2017-06-18 ENCOUNTER — Encounter: Payer: Self-pay | Admitting: Family Medicine

## 2017-07-03 DIAGNOSIS — H5213 Myopia, bilateral: Secondary | ICD-10-CM | POA: Diagnosis not present

## 2017-07-03 DIAGNOSIS — H524 Presbyopia: Secondary | ICD-10-CM | POA: Diagnosis not present

## 2017-07-03 DIAGNOSIS — H52223 Regular astigmatism, bilateral: Secondary | ICD-10-CM | POA: Diagnosis not present

## 2017-07-09 DIAGNOSIS — F419 Anxiety disorder, unspecified: Secondary | ICD-10-CM | POA: Diagnosis not present

## 2017-07-09 DIAGNOSIS — Z6827 Body mass index (BMI) 27.0-27.9, adult: Secondary | ICD-10-CM | POA: Diagnosis not present

## 2017-07-09 DIAGNOSIS — F39 Unspecified mood [affective] disorder: Secondary | ICD-10-CM | POA: Diagnosis not present

## 2017-07-09 DIAGNOSIS — J439 Emphysema, unspecified: Secondary | ICD-10-CM | POA: Diagnosis not present

## 2017-07-09 DIAGNOSIS — K3184 Gastroparesis: Secondary | ICD-10-CM | POA: Diagnosis not present

## 2017-07-23 DIAGNOSIS — Z961 Presence of intraocular lens: Secondary | ICD-10-CM | POA: Diagnosis not present

## 2017-07-23 DIAGNOSIS — H52223 Regular astigmatism, bilateral: Secondary | ICD-10-CM | POA: Diagnosis not present

## 2017-08-06 DIAGNOSIS — K3184 Gastroparesis: Secondary | ICD-10-CM | POA: Diagnosis not present

## 2017-08-06 DIAGNOSIS — F39 Unspecified mood [affective] disorder: Secondary | ICD-10-CM | POA: Diagnosis not present

## 2017-08-06 DIAGNOSIS — F419 Anxiety disorder, unspecified: Secondary | ICD-10-CM | POA: Diagnosis not present

## 2017-08-06 DIAGNOSIS — Z6827 Body mass index (BMI) 27.0-27.9, adult: Secondary | ICD-10-CM | POA: Diagnosis not present

## 2017-08-06 DIAGNOSIS — J439 Emphysema, unspecified: Secondary | ICD-10-CM | POA: Diagnosis not present

## 2017-08-08 DIAGNOSIS — R030 Elevated blood-pressure reading, without diagnosis of hypertension: Secondary | ICD-10-CM | POA: Diagnosis not present

## 2017-08-08 DIAGNOSIS — F419 Anxiety disorder, unspecified: Secondary | ICD-10-CM | POA: Diagnosis not present

## 2017-08-08 DIAGNOSIS — J439 Emphysema, unspecified: Secondary | ICD-10-CM | POA: Diagnosis not present

## 2017-08-08 DIAGNOSIS — K3184 Gastroparesis: Secondary | ICD-10-CM | POA: Diagnosis not present

## 2017-08-08 DIAGNOSIS — F39 Unspecified mood [affective] disorder: Secondary | ICD-10-CM | POA: Diagnosis not present

## 2017-08-08 DIAGNOSIS — E782 Mixed hyperlipidemia: Secondary | ICD-10-CM | POA: Diagnosis not present

## 2017-08-08 DIAGNOSIS — Z0000 Encounter for general adult medical examination without abnormal findings: Secondary | ICD-10-CM | POA: Diagnosis not present

## 2017-08-23 ENCOUNTER — Ambulatory Visit: Payer: PPO | Admitting: Family Medicine

## 2017-09-05 ENCOUNTER — Encounter: Payer: Self-pay | Admitting: Gastroenterology

## 2017-11-02 IMAGING — DX DG ANKLE COMPLETE 3+V*R*
3 series · 3 of 3 positions shown · non-contrast
Comparison: None.

CLINICAL DATA: FELL, PAIN RIGHT ANKLE, Pain in right ankle, fell
yesterday at home. HISTORY OF COPD, GERD

EXAM:
RIGHT ANKLE - COMPLETE 3+ VIEW

[ankle ap]
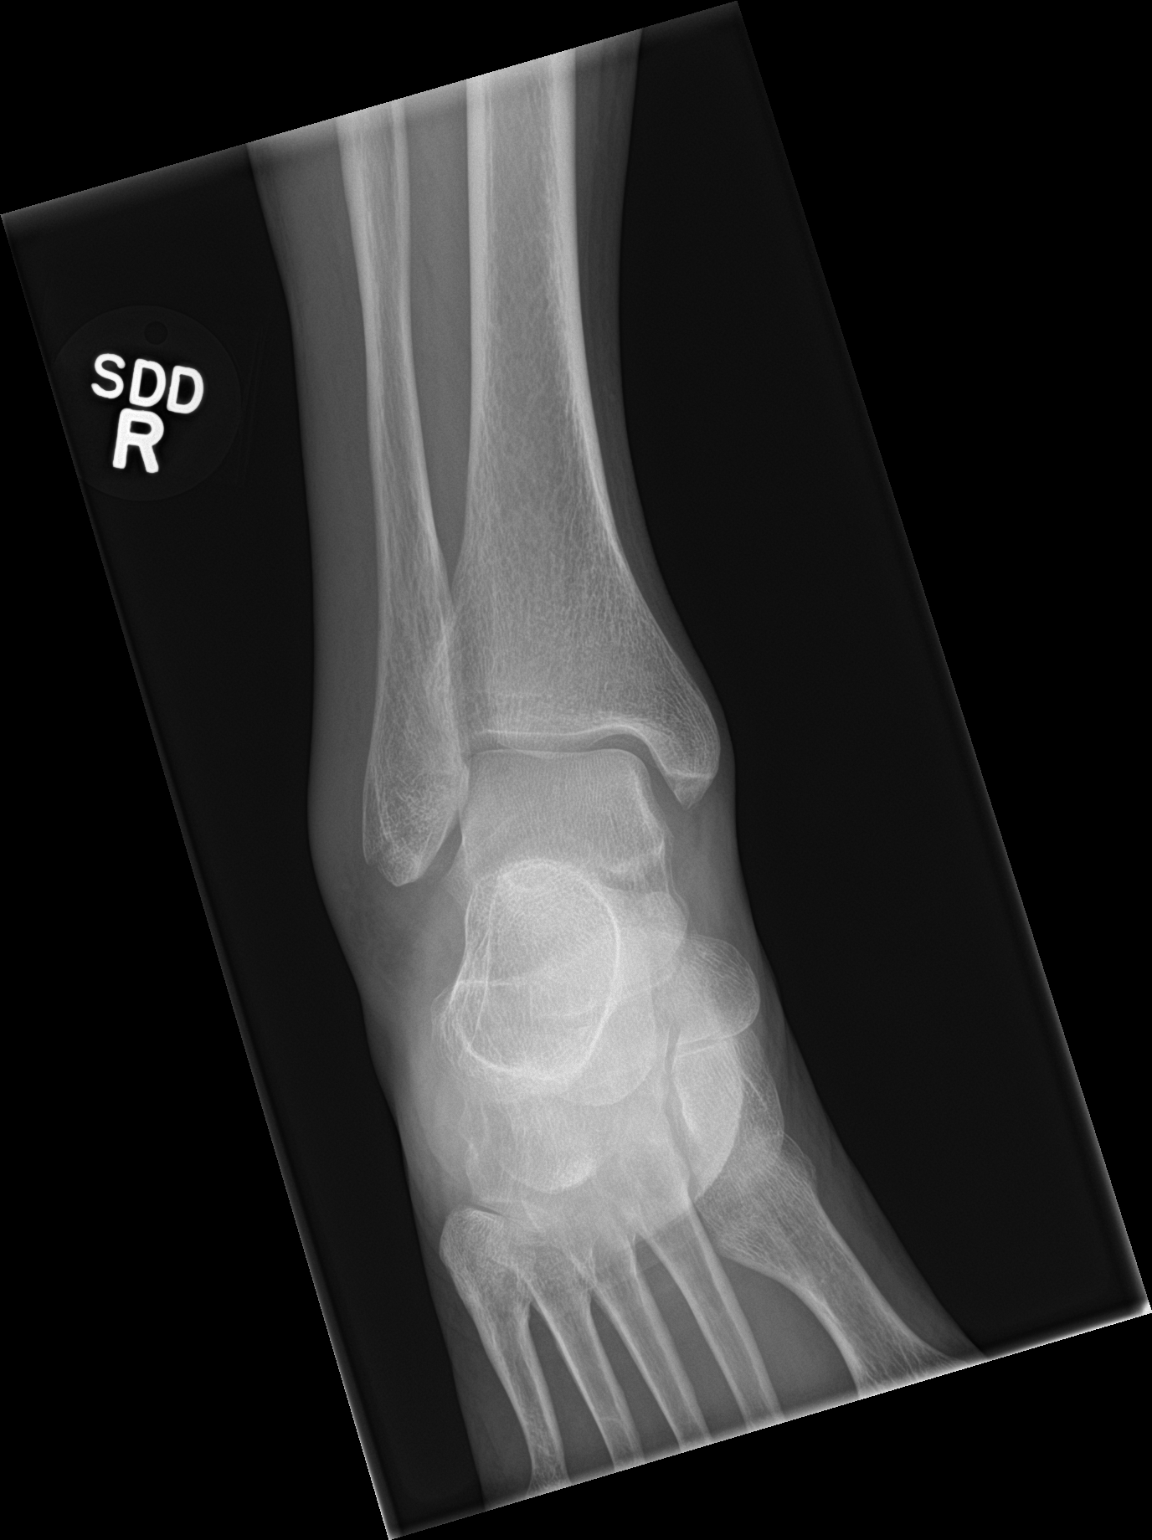

[ankle obl]
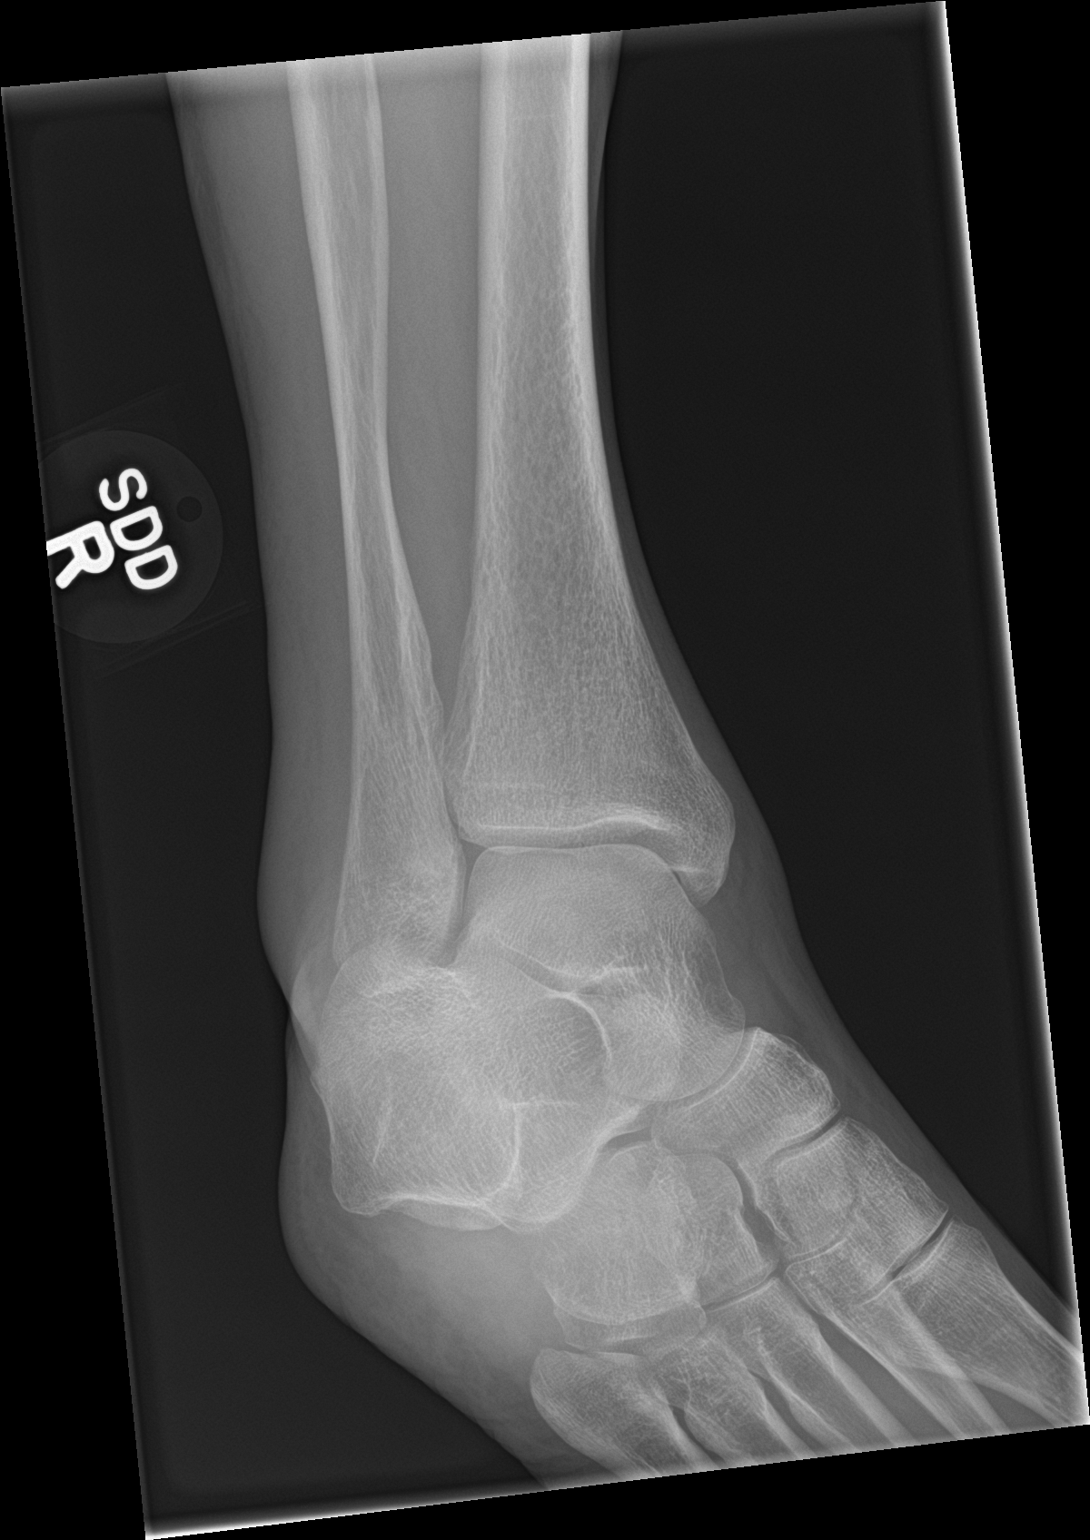

[ankle lat]
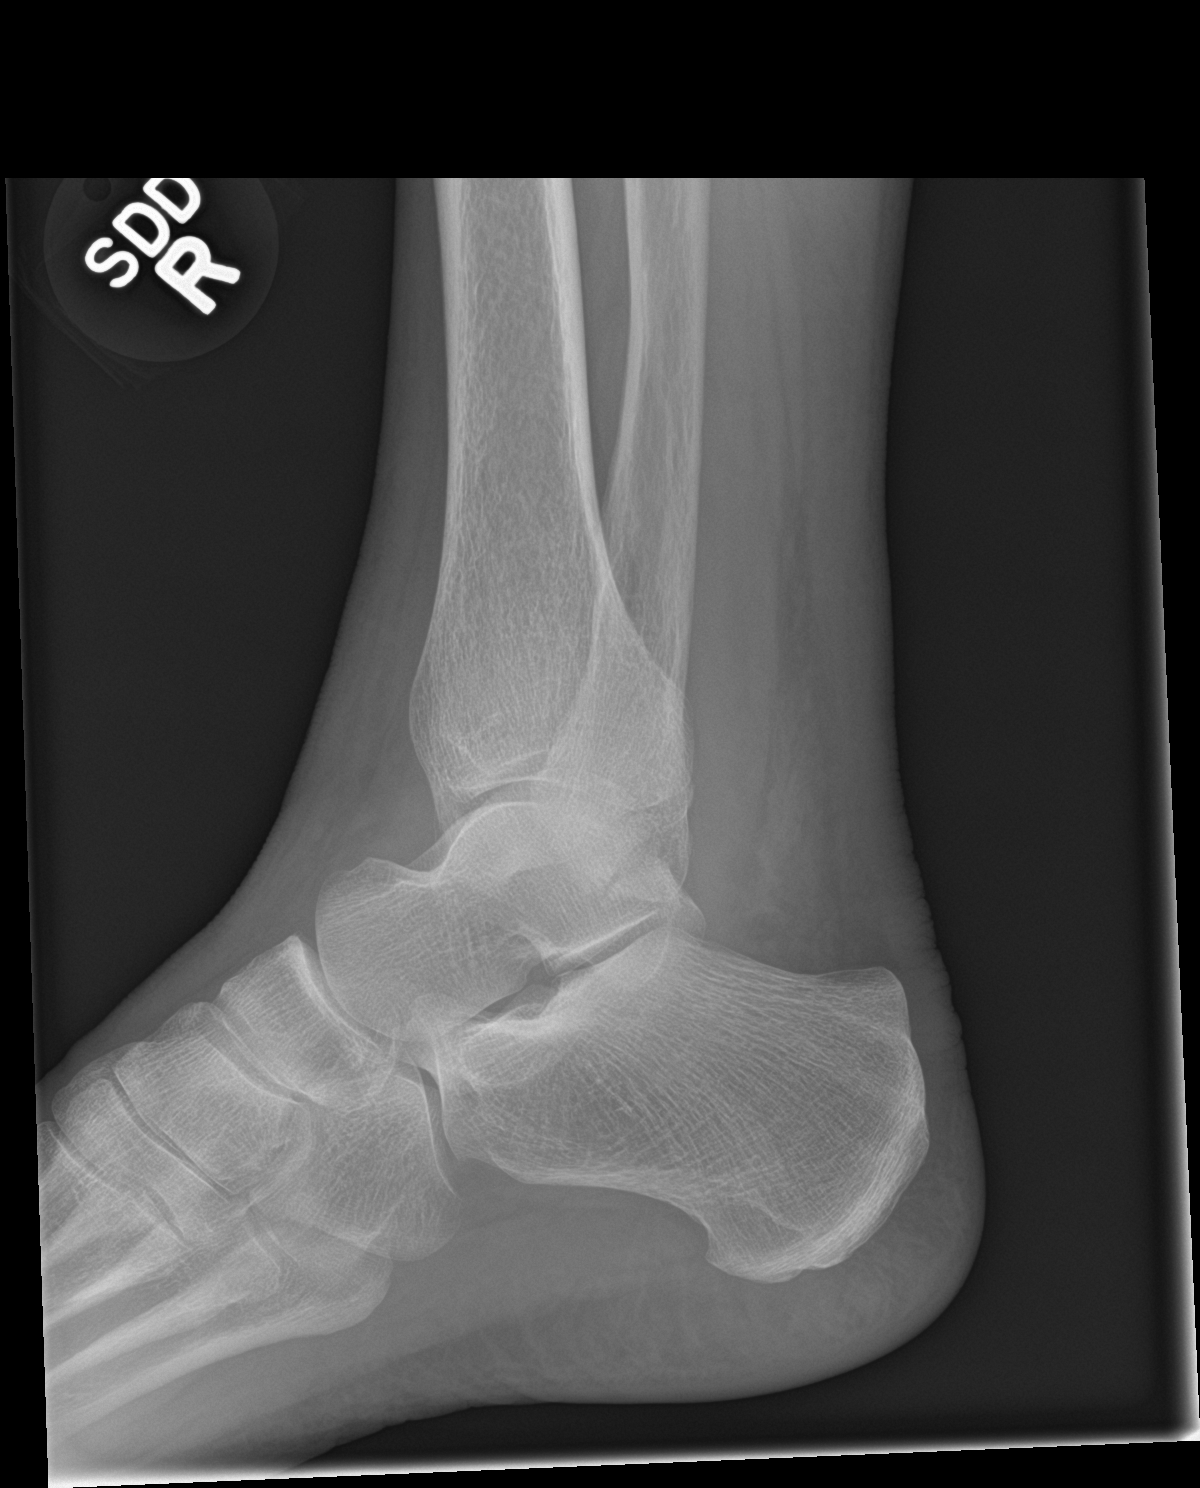

[3 of 3 positions shown; findings below may reference images not displayed]

FINDINGS: Minimally displaced fracture within the lateral malleolus. Distal
tibia appears intact and normally aligned. Ankle mortise is
symmetric. Soft tissue swelling noted over the lateral malleolus.
IMPRESSION: Minimally displaced fracture within the lateral malleolus, with
overlying soft tissue swelling.

## 2018-02-12 DIAGNOSIS — R030 Elevated blood-pressure reading, without diagnosis of hypertension: Secondary | ICD-10-CM | POA: Diagnosis not present

## 2018-02-12 DIAGNOSIS — E782 Mixed hyperlipidemia: Secondary | ICD-10-CM | POA: Diagnosis not present

## 2018-02-15 DIAGNOSIS — F39 Unspecified mood [affective] disorder: Secondary | ICD-10-CM | POA: Diagnosis not present

## 2018-02-15 DIAGNOSIS — R7301 Impaired fasting glucose: Secondary | ICD-10-CM | POA: Diagnosis not present

## 2018-02-15 DIAGNOSIS — Z2821 Immunization not carried out because of patient refusal: Secondary | ICD-10-CM | POA: Diagnosis not present

## 2018-02-15 DIAGNOSIS — E785 Hyperlipidemia, unspecified: Secondary | ICD-10-CM | POA: Diagnosis not present

## 2018-02-15 DIAGNOSIS — R030 Elevated blood-pressure reading, without diagnosis of hypertension: Secondary | ICD-10-CM | POA: Diagnosis not present

## 2018-02-15 DIAGNOSIS — F419 Anxiety disorder, unspecified: Secondary | ICD-10-CM | POA: Diagnosis not present

## 2018-02-15 DIAGNOSIS — E663 Overweight: Secondary | ICD-10-CM | POA: Diagnosis not present

## 2018-02-15 DIAGNOSIS — J439 Emphysema, unspecified: Secondary | ICD-10-CM | POA: Diagnosis not present

## 2018-07-18 DIAGNOSIS — J439 Emphysema, unspecified: Secondary | ICD-10-CM | POA: Diagnosis not present

## 2018-07-18 DIAGNOSIS — Z7901 Long term (current) use of anticoagulants: Secondary | ICD-10-CM | POA: Diagnosis not present

## 2018-07-18 DIAGNOSIS — Z0000 Encounter for general adult medical examination without abnormal findings: Secondary | ICD-10-CM | POA: Diagnosis not present

## 2018-07-18 DIAGNOSIS — J309 Allergic rhinitis, unspecified: Secondary | ICD-10-CM | POA: Diagnosis not present

## 2018-07-18 DIAGNOSIS — R05 Cough: Secondary | ICD-10-CM | POA: Diagnosis not present

## 2018-07-18 DIAGNOSIS — J9611 Chronic respiratory failure with hypoxia: Secondary | ICD-10-CM | POA: Diagnosis not present

## 2018-08-23 DIAGNOSIS — R7301 Impaired fasting glucose: Secondary | ICD-10-CM | POA: Diagnosis not present

## 2018-08-23 DIAGNOSIS — E782 Mixed hyperlipidemia: Secondary | ICD-10-CM | POA: Diagnosis not present

## 2018-08-28 DIAGNOSIS — E782 Mixed hyperlipidemia: Secondary | ICD-10-CM | POA: Diagnosis not present

## 2018-08-28 DIAGNOSIS — F419 Anxiety disorder, unspecified: Secondary | ICD-10-CM | POA: Diagnosis not present

## 2018-08-28 DIAGNOSIS — J439 Emphysema, unspecified: Secondary | ICD-10-CM | POA: Diagnosis not present

## 2018-08-28 DIAGNOSIS — E663 Overweight: Secondary | ICD-10-CM | POA: Diagnosis not present

## 2018-08-28 DIAGNOSIS — Z0000 Encounter for general adult medical examination without abnormal findings: Secondary | ICD-10-CM | POA: Diagnosis not present

## 2018-08-28 DIAGNOSIS — R7301 Impaired fasting glucose: Secondary | ICD-10-CM | POA: Diagnosis not present

## 2018-08-28 DIAGNOSIS — Z6827 Body mass index (BMI) 27.0-27.9, adult: Secondary | ICD-10-CM | POA: Diagnosis not present

## 2018-08-28 DIAGNOSIS — R030 Elevated blood-pressure reading, without diagnosis of hypertension: Secondary | ICD-10-CM | POA: Diagnosis not present

## 2018-08-29 ENCOUNTER — Other Ambulatory Visit: Payer: Self-pay | Admitting: Internal Medicine

## 2018-08-29 DIAGNOSIS — E2839 Other primary ovarian failure: Secondary | ICD-10-CM

## 2018-09-24 DIAGNOSIS — Z1212 Encounter for screening for malignant neoplasm of rectum: Secondary | ICD-10-CM | POA: Diagnosis not present

## 2018-09-24 DIAGNOSIS — Z1211 Encounter for screening for malignant neoplasm of colon: Secondary | ICD-10-CM | POA: Diagnosis not present

## 2018-11-11 ENCOUNTER — Other Ambulatory Visit: Payer: Self-pay

## 2019-02-10 IMAGING — US ULTRASOUND RIGHT BREAST LIMITED
1 series · 12 of 12 positions shown · non-contrast
Comparison: Previous exam(s).

CLINICAL DATA: Screening recall for possible right breast masses
and bilateral breast asymmetries.

EXAM:
2D DIGITAL DIAGNOSTIC UNILATERAL RIGHT MAMMOGRAM WITH CAD AND
ADJUNCT TOMO
BILATERAL BREAST ULTRASOUND

[Series 1: ultrasound right breast limited · 0.07mm/px · 12 of 12 slices shown]
[im 1/12]
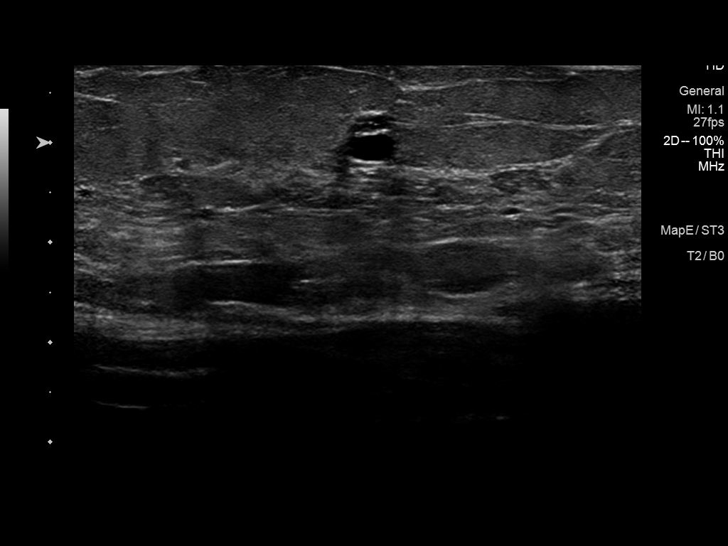
[im 2/12]
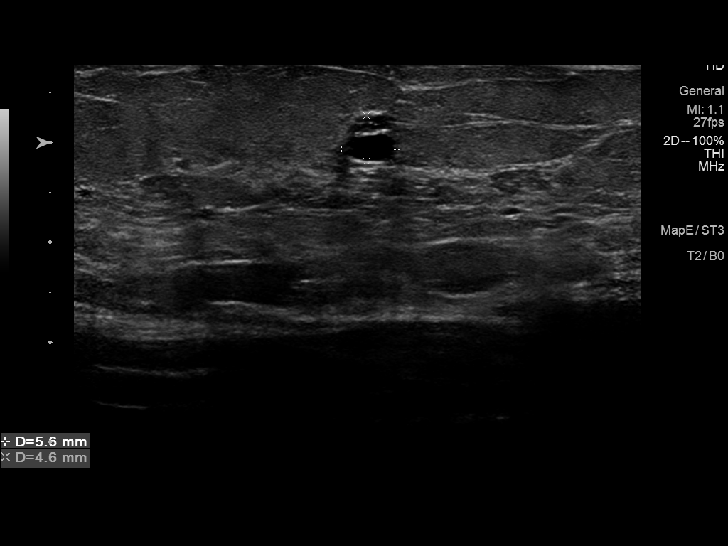
[im 3/12]
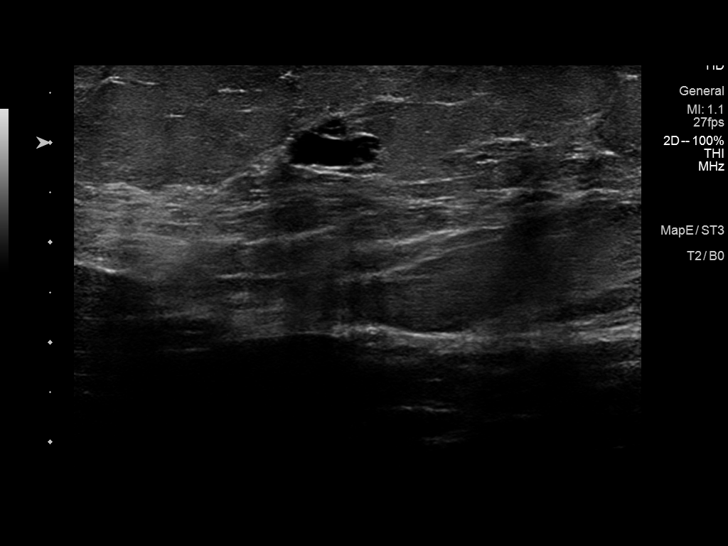
[im 4/12]
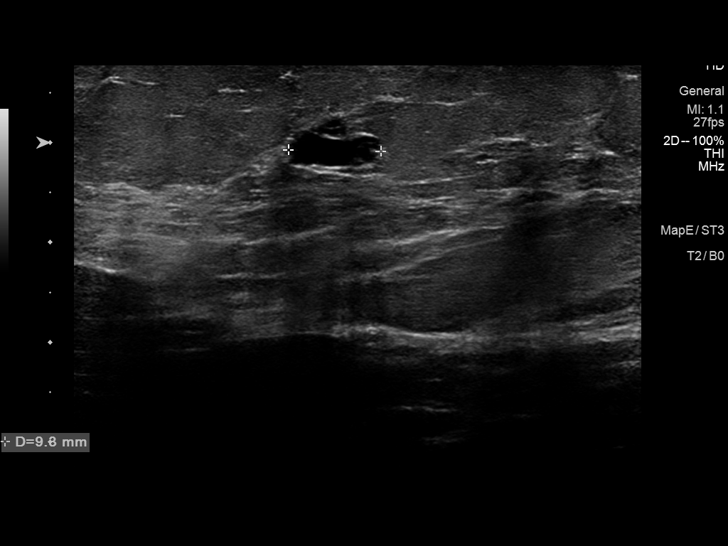
[im 5/12]
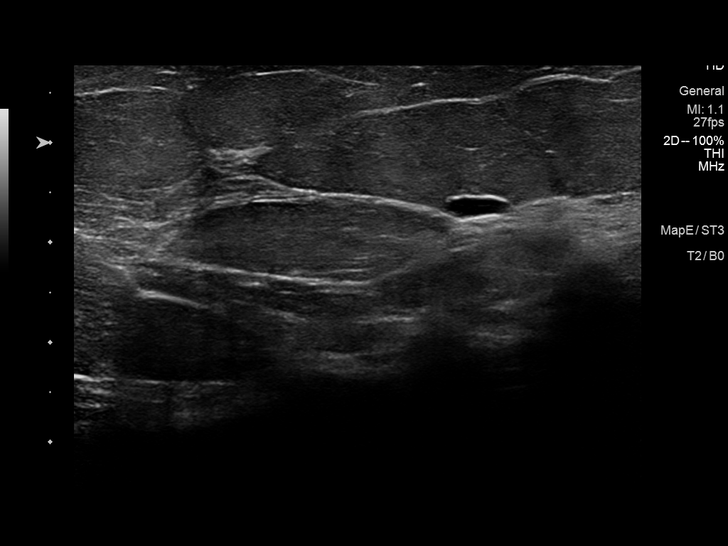
[im 6/12]
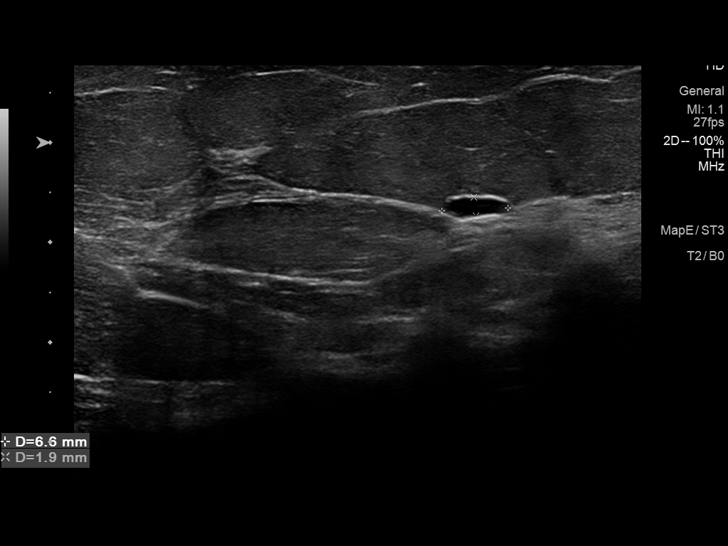
[im 7/12]
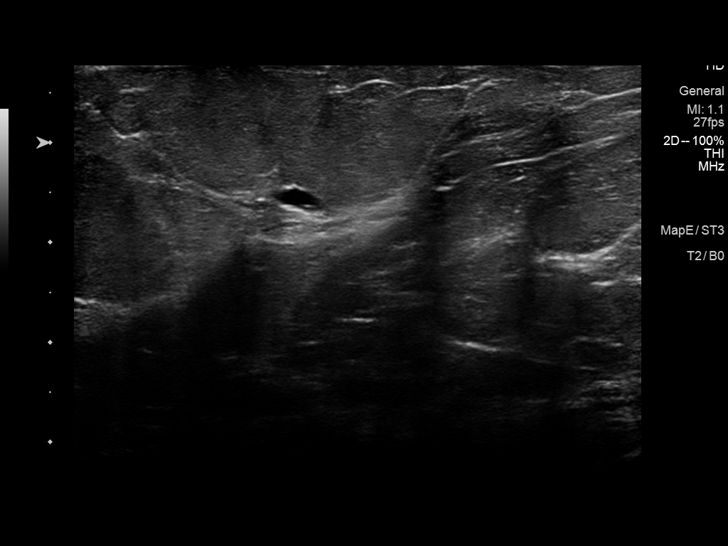
[im 8/12]
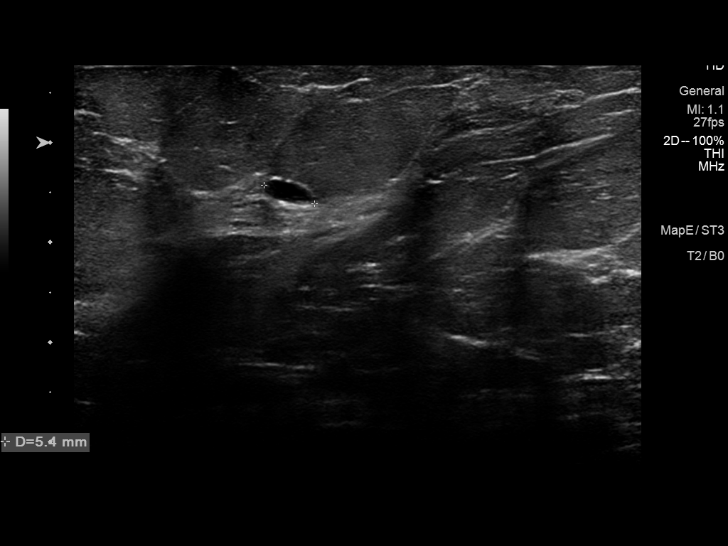
[im 9/12]
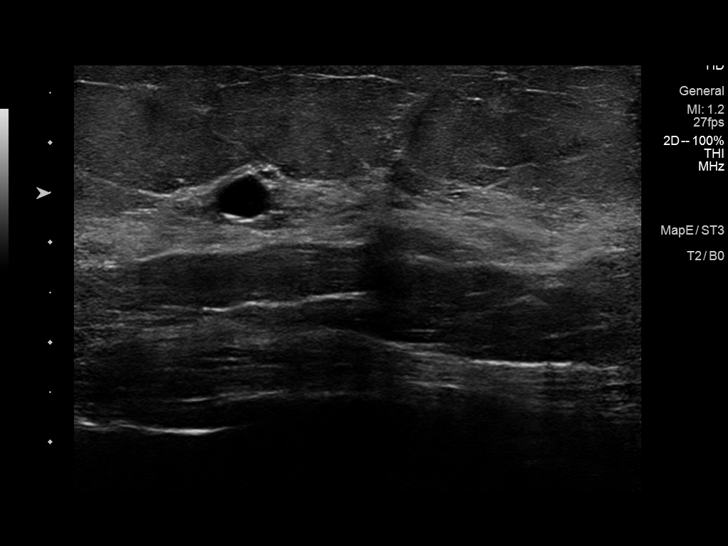
[im 10/12]
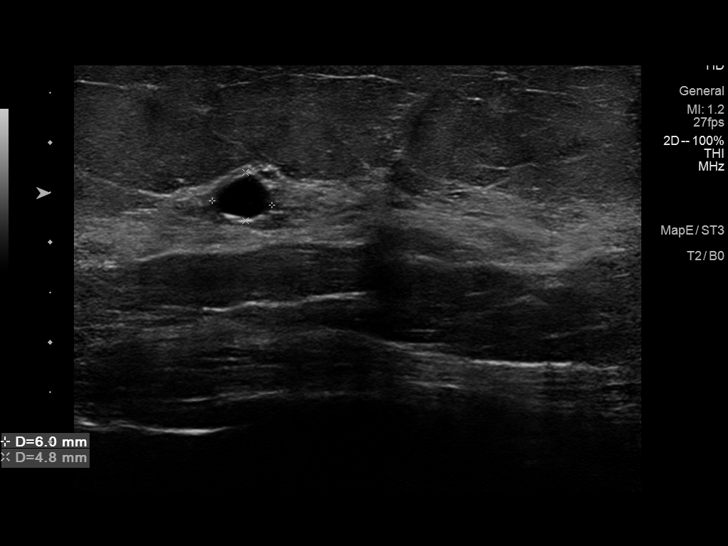
[im 11/12]
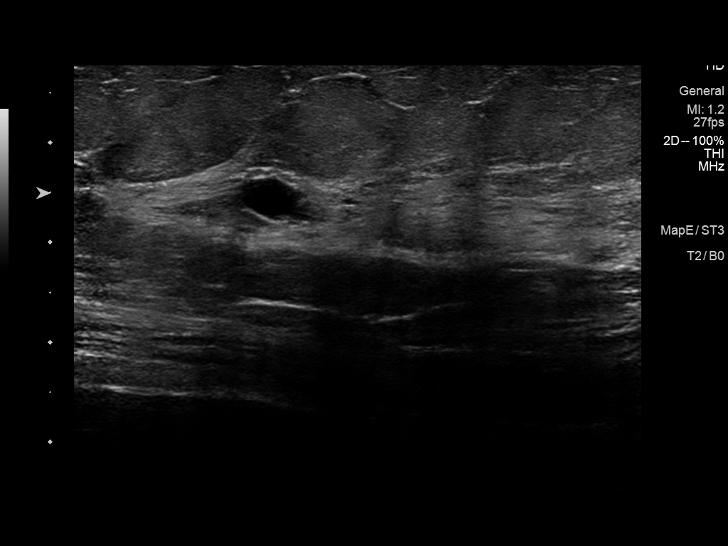
[im 12/12]
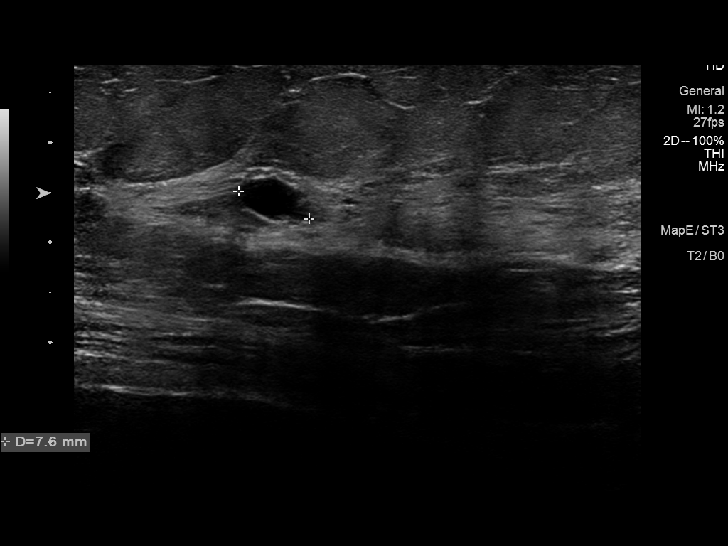

[12 of 12 positions shown; findings below may reference images not displayed]

ACR Breast Density Category c: The breast tissue is heterogeneously
dense, which may obscure small masses.
FINDINGS: The asymmetry which was question in the central posterior right
breast resolves on spot compression tomosynthesis imaging. There is
a persistent mass in the retroareolar upper outer right breast, and
in the inferior right breast, middle depth. There is also a mass in
the superior left breast at the site of the questioned asymmetry on
the screening mammogram.

Mammographic images were processed with CAD.

Ultrasound of the right breast at 11 o'clock, 2 cm from the nipple
demonstrates an anechoic cluster of cysts measuring 1.0 x 0.5 x
cm. At 6 o'clock, 2 cm from the nipple in the right breast is
another anechoic circumscribed cyst measuring 0.7 x 0.2 x 0.5 cm.

Ultrasound of the left breast at 11 o'clock, 5 cm from the nipple
demonstrates a benign cyst measuring 0.8 x 0.5 x 0.6 cm.
IMPRESSION: 1. There are multiple bilateral benign cysts in each breast
accounting for the masses and asymmetries of concern on the
screening mammogram. No mammographic evidence of malignancy in the
bilateral breasts.

RECOMMENDATION:
Screening mammogram in one year.(Code:XH-K-USD)

I have discussed the findings and recommendations with the patient.
Results were also provided in writing at the conclusion of the
visit. If applicable, a reminder letter will be sent to the patient
regarding the next appointment.

BI-RADS CATEGORY  2: Benign.

## 2019-02-25 DIAGNOSIS — E663 Overweight: Secondary | ICD-10-CM | POA: Diagnosis not present

## 2019-02-25 DIAGNOSIS — E782 Mixed hyperlipidemia: Secondary | ICD-10-CM | POA: Diagnosis not present

## 2019-02-25 DIAGNOSIS — R7301 Impaired fasting glucose: Secondary | ICD-10-CM | POA: Diagnosis not present

## 2019-03-04 DIAGNOSIS — J439 Emphysema, unspecified: Secondary | ICD-10-CM | POA: Diagnosis not present

## 2019-03-04 DIAGNOSIS — F419 Anxiety disorder, unspecified: Secondary | ICD-10-CM | POA: Diagnosis not present

## 2019-03-04 DIAGNOSIS — Z6825 Body mass index (BMI) 25.0-25.9, adult: Secondary | ICD-10-CM | POA: Diagnosis not present

## 2019-03-04 DIAGNOSIS — E782 Mixed hyperlipidemia: Secondary | ICD-10-CM | POA: Diagnosis not present

## 2019-03-04 DIAGNOSIS — R112 Nausea with vomiting, unspecified: Secondary | ICD-10-CM | POA: Diagnosis not present

## 2019-03-04 DIAGNOSIS — R7301 Impaired fasting glucose: Secondary | ICD-10-CM | POA: Diagnosis not present

## 2019-03-04 DIAGNOSIS — I10 Essential (primary) hypertension: Secondary | ICD-10-CM | POA: Diagnosis not present

## 2019-03-04 DIAGNOSIS — Z23 Encounter for immunization: Secondary | ICD-10-CM | POA: Diagnosis not present

## 2019-03-04 DIAGNOSIS — Z0001 Encounter for general adult medical examination with abnormal findings: Secondary | ICD-10-CM | POA: Diagnosis not present

## 2019-03-04 DIAGNOSIS — E663 Overweight: Secondary | ICD-10-CM | POA: Diagnosis not present

## 2019-03-04 DIAGNOSIS — R197 Diarrhea, unspecified: Secondary | ICD-10-CM | POA: Diagnosis not present

## 2019-03-10 ENCOUNTER — Other Ambulatory Visit (HOSPITAL_COMMUNITY): Payer: Self-pay | Admitting: Internal Medicine

## 2019-03-10 DIAGNOSIS — Z1231 Encounter for screening mammogram for malignant neoplasm of breast: Secondary | ICD-10-CM

## 2019-03-31 DIAGNOSIS — I10 Essential (primary) hypertension: Secondary | ICD-10-CM | POA: Diagnosis not present

## 2019-04-10 ENCOUNTER — Other Ambulatory Visit: Payer: Self-pay

## 2019-04-10 ENCOUNTER — Ambulatory Visit (HOSPITAL_COMMUNITY)
Admission: RE | Admit: 2019-04-10 | Discharge: 2019-04-10 | Disposition: A | Discharge status: Home / Self Care | Payer: PPO | Source: Ambulatory Visit | Attending: Internal Medicine | Admitting: Internal Medicine

## 2019-04-10 DIAGNOSIS — Z1231 Encounter for screening mammogram for malignant neoplasm of breast: Secondary | ICD-10-CM | POA: Diagnosis not present

## 2019-08-22 DIAGNOSIS — F39 Unspecified mood [affective] disorder: Secondary | ICD-10-CM | POA: Diagnosis not present

## 2019-08-22 DIAGNOSIS — E559 Vitamin D deficiency, unspecified: Secondary | ICD-10-CM | POA: Diagnosis not present

## 2019-08-22 DIAGNOSIS — R7301 Impaired fasting glucose: Secondary | ICD-10-CM | POA: Diagnosis not present

## 2019-08-22 DIAGNOSIS — F419 Anxiety disorder, unspecified: Secondary | ICD-10-CM | POA: Diagnosis not present

## 2019-08-22 DIAGNOSIS — I10 Essential (primary) hypertension: Secondary | ICD-10-CM | POA: Diagnosis not present

## 2019-08-22 DIAGNOSIS — K3184 Gastroparesis: Secondary | ICD-10-CM | POA: Diagnosis not present

## 2019-08-22 DIAGNOSIS — E782 Mixed hyperlipidemia: Secondary | ICD-10-CM | POA: Diagnosis not present

## 2019-08-22 DIAGNOSIS — R112 Nausea with vomiting, unspecified: Secondary | ICD-10-CM | POA: Diagnosis not present

## 2019-08-22 DIAGNOSIS — E663 Overweight: Secondary | ICD-10-CM | POA: Diagnosis not present

## 2019-08-22 DIAGNOSIS — Z23 Encounter for immunization: Secondary | ICD-10-CM | POA: Diagnosis not present

## 2019-08-22 DIAGNOSIS — R197 Diarrhea, unspecified: Secondary | ICD-10-CM | POA: Diagnosis not present

## 2019-08-22 DIAGNOSIS — Z0001 Encounter for general adult medical examination with abnormal findings: Secondary | ICD-10-CM | POA: Diagnosis not present

## 2019-08-27 DIAGNOSIS — E559 Vitamin D deficiency, unspecified: Secondary | ICD-10-CM | POA: Diagnosis not present

## 2019-08-27 DIAGNOSIS — R7301 Impaired fasting glucose: Secondary | ICD-10-CM | POA: Diagnosis not present

## 2019-08-27 DIAGNOSIS — Z6825 Body mass index (BMI) 25.0-25.9, adult: Secondary | ICD-10-CM | POA: Diagnosis not present

## 2019-08-27 DIAGNOSIS — E663 Overweight: Secondary | ICD-10-CM | POA: Diagnosis not present

## 2019-08-27 DIAGNOSIS — J439 Emphysema, unspecified: Secondary | ICD-10-CM | POA: Diagnosis not present

## 2019-08-27 DIAGNOSIS — R112 Nausea with vomiting, unspecified: Secondary | ICD-10-CM | POA: Diagnosis not present

## 2019-08-27 DIAGNOSIS — F419 Anxiety disorder, unspecified: Secondary | ICD-10-CM | POA: Diagnosis not present

## 2019-08-27 DIAGNOSIS — I10 Essential (primary) hypertension: Secondary | ICD-10-CM | POA: Diagnosis not present

## 2019-08-27 DIAGNOSIS — E782 Mixed hyperlipidemia: Secondary | ICD-10-CM | POA: Diagnosis not present

## 2019-08-27 DIAGNOSIS — Z0001 Encounter for general adult medical examination with abnormal findings: Secondary | ICD-10-CM | POA: Diagnosis not present

## 2019-08-27 DIAGNOSIS — R197 Diarrhea, unspecified: Secondary | ICD-10-CM | POA: Diagnosis not present

## 2019-08-27 DIAGNOSIS — Z23 Encounter for immunization: Secondary | ICD-10-CM | POA: Diagnosis not present

## 2019-11-06 ENCOUNTER — Other Ambulatory Visit: Payer: Self-pay

## 2020-02-11 DIAGNOSIS — E7849 Other hyperlipidemia: Secondary | ICD-10-CM | POA: Diagnosis not present

## 2020-02-11 DIAGNOSIS — Z6827 Body mass index (BMI) 27.0-27.9, adult: Secondary | ICD-10-CM | POA: Diagnosis not present

## 2020-02-11 DIAGNOSIS — I10 Essential (primary) hypertension: Secondary | ICD-10-CM | POA: Diagnosis not present

## 2020-02-11 DIAGNOSIS — E559 Vitamin D deficiency, unspecified: Secondary | ICD-10-CM | POA: Diagnosis not present

## 2020-02-11 DIAGNOSIS — J439 Emphysema, unspecified: Secondary | ICD-10-CM | POA: Diagnosis not present

## 2020-02-11 DIAGNOSIS — F39 Unspecified mood [affective] disorder: Secondary | ICD-10-CM | POA: Diagnosis not present

## 2020-02-11 DIAGNOSIS — K3184 Gastroparesis: Secondary | ICD-10-CM | POA: Diagnosis not present

## 2020-02-11 DIAGNOSIS — F419 Anxiety disorder, unspecified: Secondary | ICD-10-CM | POA: Diagnosis not present

## 2020-02-27 DIAGNOSIS — Z6825 Body mass index (BMI) 25.0-25.9, adult: Secondary | ICD-10-CM | POA: Diagnosis not present

## 2020-02-27 DIAGNOSIS — Z0001 Encounter for general adult medical examination with abnormal findings: Secondary | ICD-10-CM | POA: Diagnosis not present

## 2020-02-27 DIAGNOSIS — K3184 Gastroparesis: Secondary | ICD-10-CM | POA: Diagnosis not present

## 2020-02-27 DIAGNOSIS — Z23 Encounter for immunization: Secondary | ICD-10-CM | POA: Diagnosis not present

## 2020-02-27 DIAGNOSIS — E782 Mixed hyperlipidemia: Secondary | ICD-10-CM | POA: Diagnosis not present

## 2020-02-27 DIAGNOSIS — I10 Essential (primary) hypertension: Secondary | ICD-10-CM | POA: Diagnosis not present

## 2020-02-27 DIAGNOSIS — R112 Nausea with vomiting, unspecified: Secondary | ICD-10-CM | POA: Diagnosis not present

## 2020-02-27 DIAGNOSIS — E663 Overweight: Secondary | ICD-10-CM | POA: Diagnosis not present

## 2020-02-27 DIAGNOSIS — R7303 Prediabetes: Secondary | ICD-10-CM | POA: Diagnosis not present

## 2020-02-27 DIAGNOSIS — F419 Anxiety disorder, unspecified: Secondary | ICD-10-CM | POA: Diagnosis not present

## 2020-02-27 DIAGNOSIS — E559 Vitamin D deficiency, unspecified: Secondary | ICD-10-CM | POA: Diagnosis not present

## 2020-02-27 DIAGNOSIS — R197 Diarrhea, unspecified: Secondary | ICD-10-CM | POA: Diagnosis not present

## 2020-03-03 DIAGNOSIS — Z23 Encounter for immunization: Secondary | ICD-10-CM | POA: Diagnosis not present

## 2020-03-03 DIAGNOSIS — E782 Mixed hyperlipidemia: Secondary | ICD-10-CM | POA: Diagnosis not present

## 2020-03-03 DIAGNOSIS — R7301 Impaired fasting glucose: Secondary | ICD-10-CM | POA: Diagnosis not present

## 2020-03-03 DIAGNOSIS — E559 Vitamin D deficiency, unspecified: Secondary | ICD-10-CM | POA: Diagnosis not present

## 2020-03-03 DIAGNOSIS — F419 Anxiety disorder, unspecified: Secondary | ICD-10-CM | POA: Diagnosis not present

## 2020-03-03 DIAGNOSIS — I10 Essential (primary) hypertension: Secondary | ICD-10-CM | POA: Diagnosis not present

## 2020-03-03 DIAGNOSIS — R7303 Prediabetes: Secondary | ICD-10-CM | POA: Diagnosis not present

## 2020-03-03 DIAGNOSIS — J439 Emphysema, unspecified: Secondary | ICD-10-CM | POA: Diagnosis not present

## 2020-04-09 DIAGNOSIS — F39 Unspecified mood [affective] disorder: Secondary | ICD-10-CM | POA: Diagnosis not present

## 2020-04-09 DIAGNOSIS — K3184 Gastroparesis: Secondary | ICD-10-CM | POA: Diagnosis not present

## 2020-04-09 DIAGNOSIS — J439 Emphysema, unspecified: Secondary | ICD-10-CM | POA: Diagnosis not present

## 2020-04-09 DIAGNOSIS — F419 Anxiety disorder, unspecified: Secondary | ICD-10-CM | POA: Diagnosis not present

## 2020-04-09 DIAGNOSIS — Z6827 Body mass index (BMI) 27.0-27.9, adult: Secondary | ICD-10-CM | POA: Diagnosis not present

## 2020-05-05 ENCOUNTER — Other Ambulatory Visit (HOSPITAL_COMMUNITY): Payer: Self-pay | Admitting: Internal Medicine

## 2020-05-05 DIAGNOSIS — Z1231 Encounter for screening mammogram for malignant neoplasm of breast: Secondary | ICD-10-CM

## 2020-05-10 ENCOUNTER — Other Ambulatory Visit: Payer: Self-pay

## 2020-05-10 ENCOUNTER — Ambulatory Visit (HOSPITAL_COMMUNITY)
Admission: RE | Admit: 2020-05-10 | Discharge: 2020-05-10 | Disposition: A | Discharge status: Home / Self Care | Payer: PPO | Source: Ambulatory Visit | Attending: Internal Medicine | Admitting: Internal Medicine

## 2020-05-10 DIAGNOSIS — Z1231 Encounter for screening mammogram for malignant neoplasm of breast: Secondary | ICD-10-CM | POA: Diagnosis not present

## 2020-07-07 DIAGNOSIS — Z6827 Body mass index (BMI) 27.0-27.9, adult: Secondary | ICD-10-CM | POA: Diagnosis not present

## 2020-07-07 DIAGNOSIS — E1165 Type 2 diabetes mellitus with hyperglycemia: Secondary | ICD-10-CM | POA: Diagnosis not present

## 2020-07-07 DIAGNOSIS — I10 Essential (primary) hypertension: Secondary | ICD-10-CM | POA: Diagnosis not present

## 2020-07-07 DIAGNOSIS — K3184 Gastroparesis: Secondary | ICD-10-CM | POA: Diagnosis not present

## 2020-07-07 DIAGNOSIS — J439 Emphysema, unspecified: Secondary | ICD-10-CM | POA: Diagnosis not present

## 2020-07-07 DIAGNOSIS — F419 Anxiety disorder, unspecified: Secondary | ICD-10-CM | POA: Diagnosis not present

## 2020-07-07 DIAGNOSIS — F39 Unspecified mood [affective] disorder: Secondary | ICD-10-CM | POA: Diagnosis not present

## 2020-08-08 DIAGNOSIS — E782 Mixed hyperlipidemia: Secondary | ICD-10-CM | POA: Diagnosis not present

## 2020-08-08 DIAGNOSIS — E1165 Type 2 diabetes mellitus with hyperglycemia: Secondary | ICD-10-CM | POA: Diagnosis not present

## 2020-08-08 DIAGNOSIS — E559 Vitamin D deficiency, unspecified: Secondary | ICD-10-CM | POA: Diagnosis not present

## 2020-08-08 DIAGNOSIS — I10 Essential (primary) hypertension: Secondary | ICD-10-CM | POA: Diagnosis not present

## 2020-08-08 DIAGNOSIS — K644 Residual hemorrhoidal skin tags: Secondary | ICD-10-CM | POA: Diagnosis not present

## 2020-08-08 DIAGNOSIS — F411 Generalized anxiety disorder: Secondary | ICD-10-CM | POA: Diagnosis not present

## 2020-08-08 DIAGNOSIS — J439 Emphysema, unspecified: Secondary | ICD-10-CM | POA: Diagnosis not present

## 2020-08-08 DIAGNOSIS — R7303 Prediabetes: Secondary | ICD-10-CM | POA: Diagnosis not present

## 2020-08-27 DIAGNOSIS — E782 Mixed hyperlipidemia: Secondary | ICD-10-CM | POA: Diagnosis not present

## 2020-08-27 DIAGNOSIS — E1165 Type 2 diabetes mellitus with hyperglycemia: Secondary | ICD-10-CM | POA: Diagnosis not present

## 2020-08-27 DIAGNOSIS — I10 Essential (primary) hypertension: Secondary | ICD-10-CM | POA: Diagnosis not present

## 2020-09-01 DIAGNOSIS — I10 Essential (primary) hypertension: Secondary | ICD-10-CM | POA: Diagnosis not present

## 2020-09-01 DIAGNOSIS — J439 Emphysema, unspecified: Secondary | ICD-10-CM | POA: Diagnosis not present

## 2020-09-01 DIAGNOSIS — F411 Generalized anxiety disorder: Secondary | ICD-10-CM | POA: Diagnosis not present

## 2020-09-01 DIAGNOSIS — E782 Mixed hyperlipidemia: Secondary | ICD-10-CM | POA: Diagnosis not present

## 2020-09-01 DIAGNOSIS — K644 Residual hemorrhoidal skin tags: Secondary | ICD-10-CM | POA: Diagnosis not present

## 2020-09-01 DIAGNOSIS — R7303 Prediabetes: Secondary | ICD-10-CM | POA: Diagnosis not present

## 2020-09-01 DIAGNOSIS — E559 Vitamin D deficiency, unspecified: Secondary | ICD-10-CM | POA: Diagnosis not present

## 2020-10-07 DIAGNOSIS — E1165 Type 2 diabetes mellitus with hyperglycemia: Secondary | ICD-10-CM | POA: Diagnosis not present

## 2020-10-07 DIAGNOSIS — I10 Essential (primary) hypertension: Secondary | ICD-10-CM | POA: Diagnosis not present

## 2020-11-07 DIAGNOSIS — I10 Essential (primary) hypertension: Secondary | ICD-10-CM | POA: Diagnosis not present

## 2020-11-07 DIAGNOSIS — E1165 Type 2 diabetes mellitus with hyperglycemia: Secondary | ICD-10-CM | POA: Diagnosis not present

## 2020-12-08 DIAGNOSIS — E1165 Type 2 diabetes mellitus with hyperglycemia: Secondary | ICD-10-CM | POA: Diagnosis not present

## 2020-12-08 DIAGNOSIS — I10 Essential (primary) hypertension: Secondary | ICD-10-CM | POA: Diagnosis not present

## 2020-12-27 DIAGNOSIS — N390 Urinary tract infection, site not specified: Secondary | ICD-10-CM | POA: Diagnosis not present

## 2020-12-27 DIAGNOSIS — R350 Frequency of micturition: Secondary | ICD-10-CM | POA: Diagnosis not present

## 2020-12-27 DIAGNOSIS — Z23 Encounter for immunization: Secondary | ICD-10-CM | POA: Diagnosis not present

## 2020-12-27 DIAGNOSIS — R32 Unspecified urinary incontinence: Secondary | ICD-10-CM | POA: Diagnosis not present

## 2021-01-07 DIAGNOSIS — I10 Essential (primary) hypertension: Secondary | ICD-10-CM | POA: Diagnosis not present

## 2021-01-07 DIAGNOSIS — E1165 Type 2 diabetes mellitus with hyperglycemia: Secondary | ICD-10-CM | POA: Diagnosis not present

## 2021-03-02 DIAGNOSIS — E559 Vitamin D deficiency, unspecified: Secondary | ICD-10-CM | POA: Diagnosis not present

## 2021-03-02 DIAGNOSIS — I10 Essential (primary) hypertension: Secondary | ICD-10-CM | POA: Diagnosis not present

## 2021-03-09 DIAGNOSIS — J439 Emphysema, unspecified: Secondary | ICD-10-CM | POA: Diagnosis not present

## 2021-03-09 DIAGNOSIS — R7303 Prediabetes: Secondary | ICD-10-CM | POA: Diagnosis not present

## 2021-03-09 DIAGNOSIS — Z0001 Encounter for general adult medical examination with abnormal findings: Secondary | ICD-10-CM | POA: Diagnosis not present

## 2021-03-09 DIAGNOSIS — I10 Essential (primary) hypertension: Secondary | ICD-10-CM | POA: Diagnosis not present

## 2021-03-09 DIAGNOSIS — E559 Vitamin D deficiency, unspecified: Secondary | ICD-10-CM | POA: Diagnosis not present

## 2021-03-09 DIAGNOSIS — L309 Dermatitis, unspecified: Secondary | ICD-10-CM | POA: Diagnosis not present

## 2021-03-09 DIAGNOSIS — F411 Generalized anxiety disorder: Secondary | ICD-10-CM | POA: Diagnosis not present

## 2021-03-09 DIAGNOSIS — G720 Drug-induced myopathy: Secondary | ICD-10-CM | POA: Diagnosis not present

## 2021-03-09 DIAGNOSIS — E782 Mixed hyperlipidemia: Secondary | ICD-10-CM | POA: Diagnosis not present

## 2021-03-09 DIAGNOSIS — R040 Epistaxis: Secondary | ICD-10-CM | POA: Diagnosis not present

## 2021-05-10 DIAGNOSIS — I10 Essential (primary) hypertension: Secondary | ICD-10-CM | POA: Diagnosis not present

## 2021-05-10 DIAGNOSIS — E782 Mixed hyperlipidemia: Secondary | ICD-10-CM | POA: Diagnosis not present

## 2021-05-25 DIAGNOSIS — J029 Acute pharyngitis, unspecified: Secondary | ICD-10-CM | POA: Diagnosis not present

## 2021-05-25 DIAGNOSIS — J069 Acute upper respiratory infection, unspecified: Secondary | ICD-10-CM | POA: Diagnosis not present

## 2021-10-07 DIAGNOSIS — E782 Mixed hyperlipidemia: Secondary | ICD-10-CM | POA: Diagnosis not present

## 2021-10-07 DIAGNOSIS — E1165 Type 2 diabetes mellitus with hyperglycemia: Secondary | ICD-10-CM | POA: Diagnosis not present

## 2021-10-07 DIAGNOSIS — J439 Emphysema, unspecified: Secondary | ICD-10-CM | POA: Diagnosis not present

## 2021-10-07 DIAGNOSIS — I10 Essential (primary) hypertension: Secondary | ICD-10-CM | POA: Diagnosis not present

## 2021-12-19 DIAGNOSIS — Z1211 Encounter for screening for malignant neoplasm of colon: Secondary | ICD-10-CM | POA: Diagnosis not present

## 2021-12-20 ENCOUNTER — Other Ambulatory Visit (HOSPITAL_COMMUNITY): Payer: Self-pay | Admitting: Internal Medicine

## 2021-12-20 DIAGNOSIS — Z1231 Encounter for screening mammogram for malignant neoplasm of breast: Secondary | ICD-10-CM

## 2021-12-25 LAB — EXTERNAL GENERIC LAB PROCEDURE: COLOGUARD: NEGATIVE

## 2021-12-25 LAB — COLOGUARD: COLOGUARD: NEGATIVE

## 2021-12-26 ENCOUNTER — Ambulatory Visit (HOSPITAL_COMMUNITY)
Admission: RE | Admit: 2021-12-26 | Discharge: 2021-12-26 | Disposition: A | Discharge status: Home / Self Care | Payer: PPO | Source: Ambulatory Visit | Attending: Internal Medicine | Admitting: Internal Medicine

## 2021-12-26 DIAGNOSIS — Z1231 Encounter for screening mammogram for malignant neoplasm of breast: Secondary | ICD-10-CM | POA: Diagnosis not present

## 2022-02-27 DIAGNOSIS — E1165 Type 2 diabetes mellitus with hyperglycemia: Secondary | ICD-10-CM | POA: Diagnosis not present

## 2022-02-27 DIAGNOSIS — E782 Mixed hyperlipidemia: Secondary | ICD-10-CM | POA: Diagnosis not present

## 2022-02-27 DIAGNOSIS — I10 Essential (primary) hypertension: Secondary | ICD-10-CM | POA: Diagnosis not present

## 2022-02-27 DIAGNOSIS — E559 Vitamin D deficiency, unspecified: Secondary | ICD-10-CM | POA: Diagnosis not present

## 2022-03-06 DIAGNOSIS — J439 Emphysema, unspecified: Secondary | ICD-10-CM | POA: Diagnosis not present

## 2022-03-06 DIAGNOSIS — G720 Drug-induced myopathy: Secondary | ICD-10-CM | POA: Diagnosis not present

## 2022-03-06 DIAGNOSIS — Z23 Encounter for immunization: Secondary | ICD-10-CM | POA: Diagnosis not present

## 2022-03-06 DIAGNOSIS — R000 Tachycardia, unspecified: Secondary | ICD-10-CM | POA: Diagnosis not present

## 2022-03-06 DIAGNOSIS — F411 Generalized anxiety disorder: Secondary | ICD-10-CM | POA: Diagnosis not present

## 2022-03-06 DIAGNOSIS — R7303 Prediabetes: Secondary | ICD-10-CM | POA: Diagnosis not present

## 2022-03-06 DIAGNOSIS — E782 Mixed hyperlipidemia: Secondary | ICD-10-CM | POA: Diagnosis not present

## 2022-03-06 DIAGNOSIS — E870 Hyperosmolality and hypernatremia: Secondary | ICD-10-CM | POA: Diagnosis not present

## 2022-03-06 DIAGNOSIS — Z0000 Encounter for general adult medical examination without abnormal findings: Secondary | ICD-10-CM | POA: Diagnosis not present

## 2022-03-06 DIAGNOSIS — I10 Essential (primary) hypertension: Secondary | ICD-10-CM | POA: Diagnosis not present

## 2022-03-06 DIAGNOSIS — E559 Vitamin D deficiency, unspecified: Secondary | ICD-10-CM | POA: Diagnosis not present

## 2022-03-20 DIAGNOSIS — R000 Tachycardia, unspecified: Secondary | ICD-10-CM | POA: Diagnosis not present

## 2022-03-20 DIAGNOSIS — I10 Essential (primary) hypertension: Secondary | ICD-10-CM | POA: Diagnosis not present

## 2022-03-20 DIAGNOSIS — F5109 Other insomnia not due to a substance or known physiological condition: Secondary | ICD-10-CM | POA: Diagnosis not present

## 2022-05-04 DIAGNOSIS — D2239 Melanocytic nevi of other parts of face: Secondary | ICD-10-CM | POA: Diagnosis not present

## 2022-05-04 DIAGNOSIS — B078 Other viral warts: Secondary | ICD-10-CM | POA: Diagnosis not present

## 2022-05-04 DIAGNOSIS — L218 Other seborrheic dermatitis: Secondary | ICD-10-CM | POA: Diagnosis not present

## 2022-05-04 DIAGNOSIS — L720 Epidermal cyst: Secondary | ICD-10-CM | POA: Diagnosis not present

## 2023-03-05 DIAGNOSIS — E559 Vitamin D deficiency, unspecified: Secondary | ICD-10-CM | POA: Diagnosis not present

## 2023-03-05 DIAGNOSIS — I10 Essential (primary) hypertension: Secondary | ICD-10-CM | POA: Diagnosis not present

## 2023-03-05 DIAGNOSIS — E1165 Type 2 diabetes mellitus with hyperglycemia: Secondary | ICD-10-CM | POA: Diagnosis not present

## 2023-03-05 DIAGNOSIS — E782 Mixed hyperlipidemia: Secondary | ICD-10-CM | POA: Diagnosis not present

## 2023-03-19 DIAGNOSIS — R000 Tachycardia, unspecified: Secondary | ICD-10-CM | POA: Diagnosis not present

## 2023-03-19 DIAGNOSIS — E782 Mixed hyperlipidemia: Secondary | ICD-10-CM | POA: Diagnosis not present

## 2023-03-19 DIAGNOSIS — Z0001 Encounter for general adult medical examination with abnormal findings: Secondary | ICD-10-CM | POA: Diagnosis not present

## 2023-03-19 DIAGNOSIS — L821 Other seborrheic keratosis: Secondary | ICD-10-CM | POA: Diagnosis not present

## 2023-03-19 DIAGNOSIS — E559 Vitamin D deficiency, unspecified: Secondary | ICD-10-CM | POA: Diagnosis not present

## 2023-03-19 DIAGNOSIS — F411 Generalized anxiety disorder: Secondary | ICD-10-CM | POA: Diagnosis not present

## 2023-03-19 DIAGNOSIS — J439 Emphysema, unspecified: Secondary | ICD-10-CM | POA: Diagnosis not present

## 2023-03-19 DIAGNOSIS — G720 Drug-induced myopathy: Secondary | ICD-10-CM | POA: Diagnosis not present

## 2023-03-19 DIAGNOSIS — Z23 Encounter for immunization: Secondary | ICD-10-CM | POA: Diagnosis not present

## 2023-03-19 DIAGNOSIS — R7303 Prediabetes: Secondary | ICD-10-CM | POA: Diagnosis not present

## 2023-03-19 DIAGNOSIS — I10 Essential (primary) hypertension: Secondary | ICD-10-CM | POA: Diagnosis not present

## 2023-03-19 DIAGNOSIS — E870 Hyperosmolality and hypernatremia: Secondary | ICD-10-CM | POA: Diagnosis not present

## 2023-03-27 ENCOUNTER — Encounter (HOSPITAL_COMMUNITY): Payer: Self-pay | Admitting: Internal Medicine

## 2023-03-27 DIAGNOSIS — Z1231 Encounter for screening mammogram for malignant neoplasm of breast: Secondary | ICD-10-CM

## 2023-03-28 ENCOUNTER — Other Ambulatory Visit (HOSPITAL_COMMUNITY): Payer: Self-pay | Admitting: Internal Medicine

## 2023-03-28 DIAGNOSIS — N644 Mastodynia: Secondary | ICD-10-CM

## 2023-05-22 ENCOUNTER — Ambulatory Visit (HOSPITAL_COMMUNITY)
Admission: RE | Admit: 2023-05-22 | Discharge: 2023-05-22 | Disposition: A | Discharge status: Home / Self Care | Payer: PPO | Source: Ambulatory Visit | Attending: Internal Medicine | Admitting: Internal Medicine

## 2023-05-22 ENCOUNTER — Encounter (HOSPITAL_COMMUNITY): Payer: Self-pay

## 2023-05-22 DIAGNOSIS — N644 Mastodynia: Secondary | ICD-10-CM

## 2023-05-22 DIAGNOSIS — R92323 Mammographic fibroglandular density, bilateral breasts: Secondary | ICD-10-CM | POA: Diagnosis not present

## 2024-03-12 DIAGNOSIS — I10 Essential (primary) hypertension: Secondary | ICD-10-CM | POA: Diagnosis not present

## 2024-03-12 DIAGNOSIS — E1165 Type 2 diabetes mellitus with hyperglycemia: Secondary | ICD-10-CM | POA: Diagnosis not present

## 2024-03-12 DIAGNOSIS — E559 Vitamin D deficiency, unspecified: Secondary | ICD-10-CM | POA: Diagnosis not present

## 2024-05-13 ENCOUNTER — Other Ambulatory Visit (HOSPITAL_COMMUNITY): Payer: Self-pay | Admitting: Nurse Practitioner

## 2024-05-13 DIAGNOSIS — Z13820 Encounter for screening for osteoporosis: Secondary | ICD-10-CM

## 2024-05-14 ENCOUNTER — Other Ambulatory Visit (HOSPITAL_COMMUNITY): Payer: Self-pay | Admitting: Nurse Practitioner

## 2024-05-14 DIAGNOSIS — Z1231 Encounter for screening mammogram for malignant neoplasm of breast: Secondary | ICD-10-CM

## 2024-06-04 ENCOUNTER — Ambulatory Visit (HOSPITAL_COMMUNITY)

## 2024-06-04 ENCOUNTER — Other Ambulatory Visit (HOSPITAL_COMMUNITY)

## 2024-07-09 ENCOUNTER — Ambulatory Visit: Admitting: Cardiology
